# Patient Record
Sex: Female | Born: 1960 | Race: White | Hispanic: No | Smoking: Former smoker
Health system: Southern US, Community
[De-identification: ages and names within clinical notes are randomized; demographics above are authoritative.]

## PROBLEM LIST (undated history)

## (undated) DIAGNOSIS — I10 Essential (primary) hypertension: Secondary | ICD-10-CM

## (undated) DIAGNOSIS — M069 Rheumatoid arthritis, unspecified: Secondary | ICD-10-CM

## (undated) DIAGNOSIS — Z85118 Personal history of other malignant neoplasm of bronchus and lung: Secondary | ICD-10-CM

## (undated) DIAGNOSIS — Z9221 Personal history of antineoplastic chemotherapy: Secondary | ICD-10-CM

## (undated) DIAGNOSIS — Z923 Personal history of irradiation: Secondary | ICD-10-CM

## (undated) DIAGNOSIS — I370 Nonrheumatic pulmonary valve stenosis: Secondary | ICD-10-CM

## (undated) DIAGNOSIS — I351 Nonrheumatic aortic (valve) insufficiency: Principal | ICD-10-CM

## (undated) HISTORY — DX: Personal history of irradiation: Z92.3

## (undated) HISTORY — DX: Personal history of other malignant neoplasm of bronchus and lung: Z85.118

## (undated) HISTORY — DX: Essential (primary) hypertension: I10

## (undated) HISTORY — DX: Personal history of antineoplastic chemotherapy: Z92.21

## (undated) HISTORY — DX: Rheumatoid arthritis, unspecified: M06.9

## (undated) HISTORY — DX: Nonrheumatic aortic (valve) insufficiency: I35.1

## (undated) HISTORY — DX: Nonrheumatic pulmonary valve stenosis: I37.0

---

## 1998-01-26 ENCOUNTER — Ambulatory Visit (HOSPITAL_COMMUNITY): Admission: RE | Admit: 1998-01-26 | Discharge: 1998-01-26 | Payer: Self-pay | Admitting: Obstetrics and Gynecology

## 1999-05-22 ENCOUNTER — Other Ambulatory Visit: Admission: RE | Admit: 1999-05-22 | Discharge: 1999-05-22 | Payer: Self-pay | Admitting: Obstetrics and Gynecology

## 2000-01-27 ENCOUNTER — Encounter: Payer: Self-pay | Admitting: Internal Medicine

## 2000-01-27 ENCOUNTER — Inpatient Hospital Stay (HOSPITAL_COMMUNITY): Admission: EM | Admit: 2000-01-27 | Discharge: 2000-01-31 | Payer: Self-pay | Admitting: *Deleted

## 2000-01-29 ENCOUNTER — Encounter: Payer: Self-pay | Admitting: Internal Medicine

## 2000-03-20 ENCOUNTER — Encounter: Payer: Self-pay | Admitting: Neurology

## 2000-03-20 ENCOUNTER — Encounter: Payer: Self-pay | Admitting: Internal Medicine

## 2000-03-20 ENCOUNTER — Emergency Department (HOSPITAL_COMMUNITY): Admission: EM | Admit: 2000-03-20 | Discharge: 2000-03-20 | Payer: Self-pay | Admitting: Emergency Medicine

## 2002-01-19 ENCOUNTER — Encounter: Payer: Self-pay | Admitting: Emergency Medicine

## 2002-01-19 ENCOUNTER — Emergency Department (HOSPITAL_COMMUNITY): Admission: EM | Admit: 2002-01-19 | Discharge: 2002-01-19 | Payer: Self-pay | Admitting: Emergency Medicine

## 2004-04-10 ENCOUNTER — Encounter (INDEPENDENT_AMBULATORY_CARE_PROVIDER_SITE_OTHER): Payer: Self-pay | Admitting: Specialist

## 2004-04-10 ENCOUNTER — Inpatient Hospital Stay (HOSPITAL_COMMUNITY): Admission: EM | Admit: 2004-04-10 | Discharge: 2004-04-29 | Payer: Self-pay | Admitting: Emergency Medicine

## 2004-04-11 ENCOUNTER — Encounter (INDEPENDENT_AMBULATORY_CARE_PROVIDER_SITE_OTHER): Payer: Self-pay | Admitting: *Deleted

## 2004-04-12 ENCOUNTER — Encounter: Payer: Self-pay | Admitting: Thoracic Surgery

## 2004-04-12 ENCOUNTER — Ambulatory Visit: Payer: Self-pay | Admitting: Oncology

## 2004-04-13 ENCOUNTER — Encounter (INDEPENDENT_AMBULATORY_CARE_PROVIDER_SITE_OTHER): Payer: Self-pay | Admitting: *Deleted

## 2004-04-13 ENCOUNTER — Ambulatory Visit: Payer: Self-pay | Admitting: Dentistry

## 2004-04-17 ENCOUNTER — Encounter (INDEPENDENT_AMBULATORY_CARE_PROVIDER_SITE_OTHER): Payer: Self-pay | Admitting: Specialist

## 2004-04-18 ENCOUNTER — Encounter: Payer: Self-pay | Admitting: Oncology

## 2004-04-19 ENCOUNTER — Ambulatory Visit: Payer: Self-pay | Admitting: Oncology

## 2004-04-29 ENCOUNTER — Ambulatory Visit: Payer: Self-pay | Admitting: Oncology

## 2004-05-06 ENCOUNTER — Ambulatory Visit: Payer: Self-pay | Admitting: Oncology

## 2004-05-22 ENCOUNTER — Emergency Department (HOSPITAL_COMMUNITY): Admission: EM | Admit: 2004-05-22 | Discharge: 2004-05-23 | Payer: Self-pay | Admitting: Emergency Medicine

## 2004-06-06 ENCOUNTER — Ambulatory Visit: Admission: RE | Admit: 2004-06-06 | Discharge: 2004-06-06 | Payer: Self-pay | Admitting: Radiation Oncology

## 2004-06-14 ENCOUNTER — Ambulatory Visit (HOSPITAL_COMMUNITY): Admission: RE | Admit: 2004-06-14 | Discharge: 2004-06-14 | Payer: Self-pay | Admitting: Oncology

## 2004-06-21 ENCOUNTER — Ambulatory Visit: Payer: Self-pay | Admitting: Oncology

## 2004-06-24 ENCOUNTER — Ambulatory Visit: Payer: Self-pay | Admitting: Psychology

## 2004-07-01 ENCOUNTER — Ambulatory Visit: Payer: Self-pay | Admitting: Psychology

## 2004-07-08 ENCOUNTER — Ambulatory Visit: Payer: Self-pay | Admitting: Psychology

## 2004-07-15 ENCOUNTER — Ambulatory Visit: Payer: Self-pay | Admitting: Psychology

## 2004-08-05 ENCOUNTER — Ambulatory Visit (HOSPITAL_COMMUNITY): Admission: RE | Admit: 2004-08-05 | Discharge: 2004-08-05 | Payer: Self-pay | Admitting: Oncology

## 2004-08-12 ENCOUNTER — Ambulatory Visit: Admission: RE | Admit: 2004-08-12 | Discharge: 2004-09-30 | Payer: Self-pay | Admitting: Radiation Oncology

## 2004-08-13 ENCOUNTER — Ambulatory Visit: Payer: Self-pay | Admitting: Oncology

## 2004-08-15 ENCOUNTER — Ambulatory Visit: Payer: Self-pay | Admitting: Professional

## 2004-08-22 ENCOUNTER — Ambulatory Visit: Payer: Self-pay | Admitting: Professional

## 2004-08-29 ENCOUNTER — Ambulatory Visit: Payer: Self-pay | Admitting: Professional

## 2004-09-05 ENCOUNTER — Ambulatory Visit: Payer: Self-pay | Admitting: Professional

## 2004-09-19 ENCOUNTER — Ambulatory Visit: Payer: Self-pay | Admitting: Professional

## 2004-10-03 ENCOUNTER — Ambulatory Visit: Payer: Self-pay | Admitting: Professional

## 2004-10-17 ENCOUNTER — Ambulatory Visit: Payer: Self-pay | Admitting: Professional

## 2004-11-04 ENCOUNTER — Ambulatory Visit: Payer: Self-pay | Admitting: Oncology

## 2004-11-05 ENCOUNTER — Ambulatory Visit (HOSPITAL_COMMUNITY): Admission: RE | Admit: 2004-11-05 | Discharge: 2004-11-05 | Payer: Self-pay | Admitting: Oncology

## 2004-11-12 ENCOUNTER — Ambulatory Visit (HOSPITAL_COMMUNITY): Admission: RE | Admit: 2004-11-12 | Discharge: 2004-11-12 | Payer: Self-pay | Admitting: Oncology

## 2004-12-12 ENCOUNTER — Encounter: Admission: RE | Admit: 2004-12-12 | Discharge: 2004-12-12 | Payer: Self-pay | Admitting: Obstetrics and Gynecology

## 2005-01-19 ENCOUNTER — Ambulatory Visit: Payer: Self-pay | Admitting: Oncology

## 2005-01-29 ENCOUNTER — Ambulatory Visit (HOSPITAL_COMMUNITY): Admission: RE | Admit: 2005-01-29 | Discharge: 2005-01-29 | Payer: Self-pay | Admitting: Oncology

## 2005-02-04 ENCOUNTER — Ambulatory Visit (HOSPITAL_COMMUNITY): Admission: RE | Admit: 2005-02-04 | Discharge: 2005-02-04 | Payer: Self-pay | Admitting: Oncology

## 2005-05-15 ENCOUNTER — Ambulatory Visit: Payer: Self-pay | Admitting: Oncology

## 2005-05-30 ENCOUNTER — Ambulatory Visit (HOSPITAL_COMMUNITY): Admission: RE | Admit: 2005-05-30 | Discharge: 2005-05-30 | Payer: Self-pay | Admitting: Oncology

## 2005-07-09 ENCOUNTER — Ambulatory Visit: Payer: Self-pay | Admitting: Oncology

## 2005-07-11 LAB — CBC WITH DIFFERENTIAL/PLATELET
BASO%: 0.4 % (ref 0.0–2.0)
HCT: 36.2 % (ref 34.8–46.6)
LYMPH%: 16.2 % (ref 14.0–48.0)
MCH: 30.4 pg (ref 26.0–34.0)
MCHC: 33.8 g/dL (ref 32.0–36.0)
MCV: 90 fL (ref 81.0–101.0)
MONO#: 0.4 10*3/uL (ref 0.1–0.9)
MONO%: 5.4 % (ref 0.0–13.0)
NEUT%: 73.8 % (ref 39.6–76.8)
Platelets: 330 10*3/uL (ref 145–400)
WBC: 6.9 10*3/uL (ref 3.9–10.0)

## 2005-07-11 LAB — COMPREHENSIVE METABOLIC PANEL
ALT: 13 U/L (ref 0–40)
Alkaline Phosphatase: 55 U/L (ref 39–117)
CO2: 29 mEq/L (ref 19–32)
Creatinine, Ser: 0.8 mg/dL (ref 0.4–1.2)
Total Bilirubin: 0.4 mg/dL (ref 0.3–1.2)

## 2005-08-13 ENCOUNTER — Ambulatory Visit (HOSPITAL_COMMUNITY): Admission: RE | Admit: 2005-08-13 | Discharge: 2005-08-13 | Payer: Self-pay | Admitting: Oncology

## 2005-11-12 ENCOUNTER — Ambulatory Visit: Payer: Self-pay | Admitting: Oncology

## 2005-11-14 ENCOUNTER — Ambulatory Visit (HOSPITAL_COMMUNITY): Admission: RE | Admit: 2005-11-14 | Discharge: 2005-11-14 | Payer: Self-pay | Admitting: Oncology

## 2005-11-14 LAB — COMPREHENSIVE METABOLIC PANEL
ALT: 11 U/L (ref 0–40)
AST: 11 U/L (ref 0–37)
Alkaline Phosphatase: 65 U/L (ref 39–117)
Creatinine, Ser: 0.71 mg/dL (ref 0.40–1.20)
Sodium: 140 mEq/L (ref 135–145)
Total Bilirubin: 0.4 mg/dL (ref 0.3–1.2)
Total Protein: 6.9 g/dL (ref 6.0–8.3)

## 2005-11-14 LAB — CBC WITH DIFFERENTIAL/PLATELET
BASO%: 0.3 % (ref 0.0–2.0)
EOS%: 3.4 % (ref 0.0–7.0)
HCT: 37 % (ref 34.8–46.6)
LYMPH%: 12.1 % — ABNORMAL LOW (ref 14.0–48.0)
MCH: 30.9 pg (ref 26.0–34.0)
MCHC: 34 g/dL (ref 32.0–36.0)
NEUT%: 80 % — ABNORMAL HIGH (ref 39.6–76.8)
Platelets: 329 10*3/uL (ref 145–400)
RBC: 4.07 10*6/uL (ref 3.70–5.32)
WBC: 8.7 10*3/uL (ref 3.9–10.0)

## 2005-12-05 ENCOUNTER — Ambulatory Visit (HOSPITAL_COMMUNITY): Admission: RE | Admit: 2005-12-05 | Discharge: 2005-12-05 | Payer: Self-pay | Admitting: Oncology

## 2005-12-10 ENCOUNTER — Ambulatory Visit (HOSPITAL_COMMUNITY): Admission: RE | Admit: 2005-12-10 | Discharge: 2005-12-10 | Payer: Self-pay | Admitting: Oncology

## 2006-01-06 ENCOUNTER — Encounter: Admission: RE | Admit: 2006-01-06 | Discharge: 2006-01-06 | Payer: Self-pay | Admitting: Orthopedic Surgery

## 2006-01-07 ENCOUNTER — Encounter: Admission: RE | Admit: 2006-01-07 | Discharge: 2006-04-07 | Payer: Self-pay | Admitting: Orthopedic Surgery

## 2006-02-23 IMAGING — CT NM PET TUM IMG SKULL BASE T - THIGH
5 series · 25 of 25 positions shown · IV contrast ([ID])
Comparison: 06/14/04.
COMPARISON: 04/13/04.
COMPARISON: PET CT 04/12/04.

CLINICAL DATA: Lung cancer status post chemotherapy
CHEST CT WITH CONTRAST:
TECHNIQUE: Multidetector CT imaging of the chest was performed following the standard protocol during bolus administration of intravenous contrast.
Contrast:  140cc Omnipaque 300 was utilized.
TECHNIQUE: Multidetector CT imaging of the abdomen was performed following the standard protocol during bolus administration of intravenous contrast.
TECHNIQUE: 16.8 mCi F-18 FDG was injected intravenously via the left antecubital vein.  Full-ring PET imaging was performed from the skull base through the mid-thighs 60 minutes after injection.  CT data was obtained and used for attenuation correction and anatomic localization only.  (This was not acquired as a diagnostic CT examination).

[Series 1: pet ac · axial · 3.3mm · 4.69mm/px · z∈[-870,+0]mm · 7 of 267 slices shown]
[im 1/267]
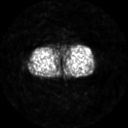
[im 45/267]
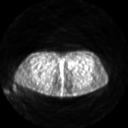
[im 89/267]
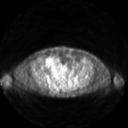
[im 134/267]
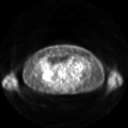
[im 178/267]
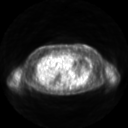
[im 222/267]
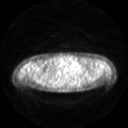
[im 267/267]
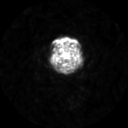

[Series 2: ct images · axial · 3.8mm · 0.98mm/px · z∈[-870,+0]mm · 8 of 267 slices shown]
[im 1/267]
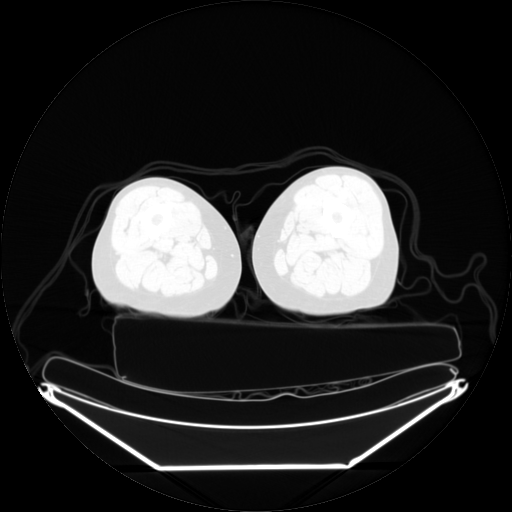
[im 39/267]
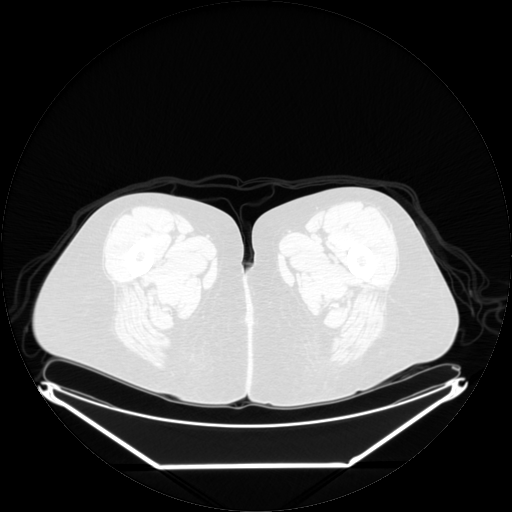
[im 77/267]
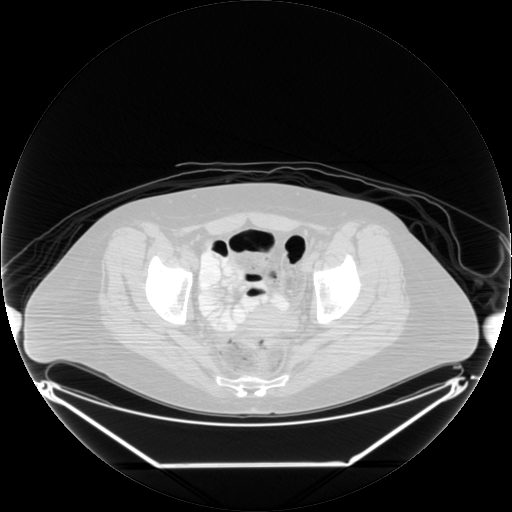
[im 115/267]
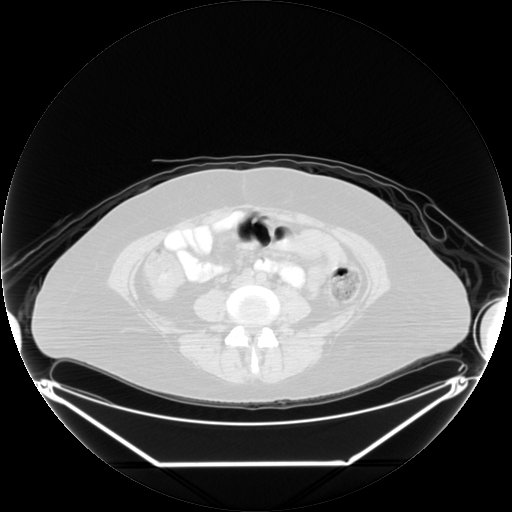
[im 153/267]
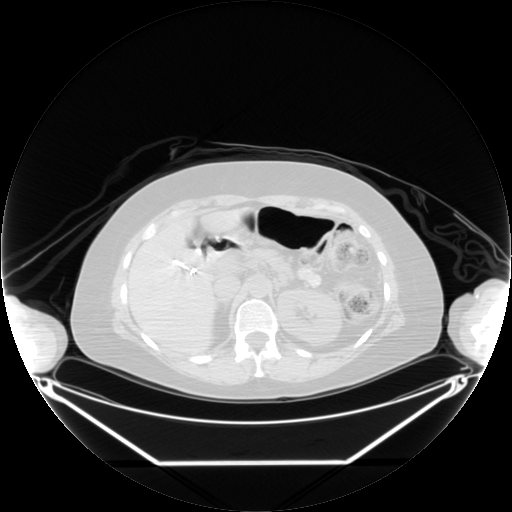
[im 191/267]
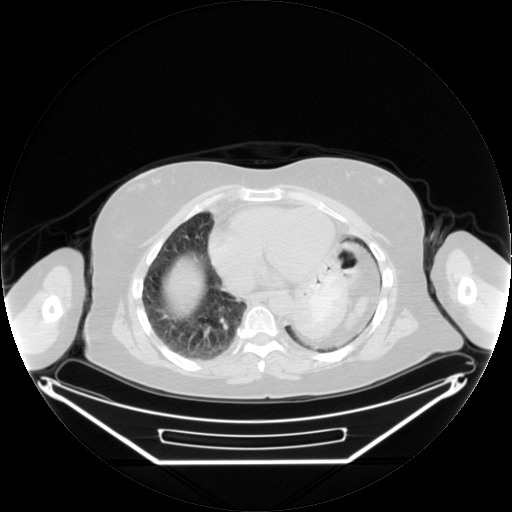
[im 229/267]
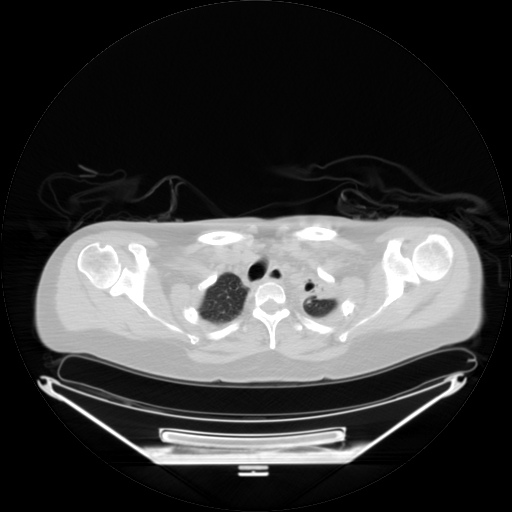
[im 267/267]
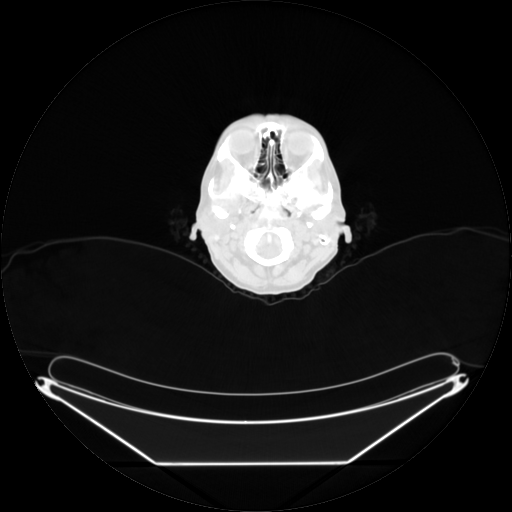

[Series 2: pet nac · axial · 3.3mm · 4.69mm/px · z∈[-870,+0]mm · 8 of 267 slices shown]
[im 1/267]
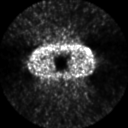
[im 39/267]
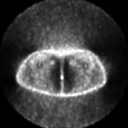
[im 77/267]
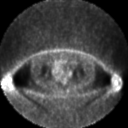
[im 115/267]
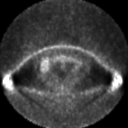
[im 153/267]
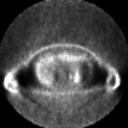
[im 191/267]
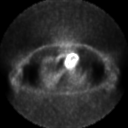
[im 229/267]
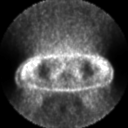
[im 267/267]
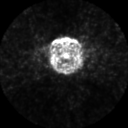

[Series 123: mip · coronal · 3.3mm · 4.69mm/px · 1 of 30 slices shown]
[im 1/30]
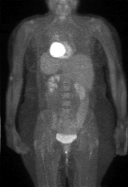

[Series 151: reformatted · axial · 3.3mm · 0.98mm/px · 1 of 10 slices shown]
[im 1/10]
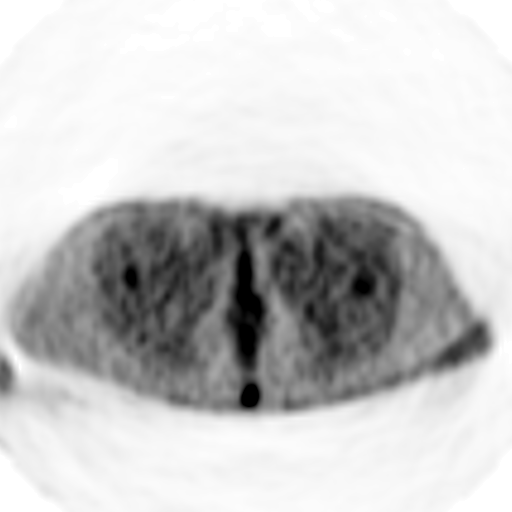

[25 of 25 positions shown; findings below may reference images not displayed]

FINDINGS: There is a similar appearance of left upper lobe collapse and atelectatic changes.  Although there is mild fullness near the left hilar region, a well-defined measurable mass is not identified.  No new abnormality detected.
IMPRESSION: Stable post therapy exam without new abnormality detected.
ABDOMEN CT WITH CONTRAST:
FINDINGS: Left pleural effusion has cleared since prior exam.  Status post cholecystectomy.  Slightly heterogeneous appearance of the peripheral aspect of the liver without focal lesions suspicious for malignancy identified.  Right renal cyst unchanged. Otherwise no focal renal, adrenal, splenic or pancreatic lesion identified.  Mild atherosclerotic type changes of the abdominal aorta without aneurysmal dilatation. No surrounding abnormality. No bony destructive lesion.  Degenerative changes lumbar spine with multifactorial spinal stenosis lower lumbar region.  Elevation of the left hemidiaphragm.
IMPRESSION: No evidence of metastatic disease in the region of the abdomen.
FDG PET-CT TUMOR IMAGING (SKULL BASE TO THIGHS):
Fasting Blood Glucose:  86.
FINDINGS: There has been significant improvement since prior examination.  At such time, FDG uptake was noted involving the left lung with maximal SUV within left upper lobe mass of 23.5 and pleural nodularity with maximal SUV of 14.   small amount of residual FDG uptake along the medial aspect of the left upper lung zone/left hilar region with maximal SUV of 5 is noted.
Previously, there was prominent FDG uptake along the right aspect of the mediastinum anterior and lateral to the ascending aorta and anterior to the superior vena cava which had a maximal SUV of 19, and now has a maximal SUV of 4. What remains at the present time may represent residual tumor/treated tumor and can be evaluated on close follow-up.
FDG uptake along the pharyngeal tonsils, palatine tonsils, and cricopharyngeal region felt to represent normal finding.
Focal small area of FDG uptake within soft tissue just to the left of the upper gluteal crease (image 219) with maximal SUV of 7.2. This was noted previously and is of questionable significance.  This would be somewhat atypical for a metastatic focus, although this cannot be completely excluded. It is possible this is related to infected sebaceous cyst or other soft tissue abnormality.
IMPRESSION: 1.  Significant improvement with marked decrease in amount of FDG uptake involving left upper lung mass, adjacent pleural thickening and left hilar uptake with residual FDG uptake in this region with maximal SUV of 5.  There has also been a significant decrease in FDG uptake along the right mediastinal region with residual FDG uptake with a maximal SUV of 4.  What remains may represent residual/treated tumor and can be further evaluated on close follow-up.
2.  Nonspecific finding of focal prominent FDG uptake along the left aspect of the upper gluteal crease unchanged.  This may be unrelated to malignancy such as an infected sebaceous cyst, although malignancy cannot be completely excluded.

## 2006-04-22 ENCOUNTER — Ambulatory Visit: Payer: Self-pay | Admitting: Oncology

## 2006-05-20 LAB — COMPREHENSIVE METABOLIC PANEL
CO2: 26 mEq/L (ref 19–32)
Creatinine, Ser: 0.66 mg/dL (ref 0.40–1.20)
Glucose, Bld: 88 mg/dL (ref 70–99)
Total Bilirubin: 0.3 mg/dL (ref 0.3–1.2)

## 2006-05-20 LAB — CBC WITH DIFFERENTIAL/PLATELET
Eosinophils Absolute: 0.2 10*3/uL (ref 0.0–0.5)
HCT: 37.4 % (ref 34.8–46.6)
LYMPH%: 14.3 % (ref 14.0–48.0)
MCHC: 34.5 g/dL (ref 32.0–36.0)
MONO#: 0.5 10*3/uL (ref 0.1–0.9)
NEUT#: 6.5 10*3/uL (ref 1.5–6.5)
NEUT%: 77.7 % — ABNORMAL HIGH (ref 39.6–76.8)
Platelets: 292 10*3/uL (ref 145–400)
WBC: 8.4 10*3/uL (ref 3.9–10.0)

## 2006-08-14 ENCOUNTER — Ambulatory Visit: Payer: Self-pay | Admitting: Oncology

## 2006-08-17 ENCOUNTER — Ambulatory Visit (HOSPITAL_COMMUNITY): Admission: RE | Admit: 2006-08-17 | Discharge: 2006-08-17 | Payer: Self-pay | Admitting: Oncology

## 2006-08-19 LAB — CBC WITH DIFFERENTIAL/PLATELET
Eosinophils Absolute: 0.3 10*3/uL (ref 0.0–0.5)
HCT: 36.3 % (ref 34.8–46.6)
LYMPH%: 14.5 % (ref 14.0–48.0)
MONO#: 0.4 10*3/uL (ref 0.1–0.9)
NEUT#: 6.6 10*3/uL — ABNORMAL HIGH (ref 1.5–6.5)
NEUT%: 76.9 % — ABNORMAL HIGH (ref 39.6–76.8)
Platelets: 329 10*3/uL (ref 145–400)
WBC: 8.5 10*3/uL (ref 3.9–10.0)

## 2006-08-19 LAB — COMPREHENSIVE METABOLIC PANEL
CO2: 25 mEq/L (ref 19–32)
Glucose, Bld: 102 mg/dL — ABNORMAL HIGH (ref 70–99)
Sodium: 143 mEq/L (ref 135–145)
Total Bilirubin: 0.3 mg/dL (ref 0.3–1.2)
Total Protein: 7.1 g/dL (ref 6.0–8.3)

## 2006-10-29 ENCOUNTER — Encounter: Admission: RE | Admit: 2006-10-29 | Discharge: 2006-10-29 | Payer: Self-pay | Admitting: Obstetrics and Gynecology

## 2006-11-19 ENCOUNTER — Ambulatory Visit: Payer: Self-pay | Admitting: Oncology

## 2006-11-23 LAB — COMPREHENSIVE METABOLIC PANEL
Albumin: 4 g/dL (ref 3.5–5.2)
CO2: 26 mEq/L (ref 19–32)
Calcium: 9.7 mg/dL (ref 8.4–10.5)
Chloride: 105 mEq/L (ref 96–112)
Glucose, Bld: 82 mg/dL (ref 70–99)
Potassium: 4.7 mEq/L (ref 3.5–5.3)
Sodium: 141 mEq/L (ref 135–145)
Total Protein: 7.3 g/dL (ref 6.0–8.3)

## 2006-11-23 LAB — CBC WITH DIFFERENTIAL/PLATELET
Eosinophils Absolute: 0.2 10*3/uL (ref 0.0–0.5)
LYMPH%: 14.8 % (ref 14.0–48.0)
MONO#: 0.6 10*3/uL (ref 0.1–0.9)
NEUT#: 6.5 10*3/uL (ref 1.5–6.5)
Platelets: 358 10*3/uL (ref 145–400)
RBC: 4.22 10*6/uL (ref 3.70–5.32)
RDW: 15.7 % — ABNORMAL HIGH (ref 11.3–14.5)
WBC: 8.6 10*3/uL (ref 3.9–10.0)
lymph#: 1.3 10*3/uL (ref 0.9–3.3)

## 2006-11-30 ENCOUNTER — Ambulatory Visit (HOSPITAL_COMMUNITY): Admission: RE | Admit: 2006-11-30 | Discharge: 2006-11-30 | Payer: Self-pay | Admitting: Oncology

## 2007-01-06 ENCOUNTER — Encounter: Admission: RE | Admit: 2007-01-06 | Discharge: 2007-01-06 | Payer: Self-pay | Admitting: Orthopedic Surgery

## 2007-06-04 ENCOUNTER — Ambulatory Visit: Payer: Self-pay | Admitting: Oncology

## 2007-06-08 ENCOUNTER — Ambulatory Visit (HOSPITAL_COMMUNITY): Admission: RE | Admit: 2007-06-08 | Discharge: 2007-06-08 | Payer: Self-pay | Admitting: Oncology

## 2007-06-09 LAB — COMPREHENSIVE METABOLIC PANEL
ALT: 11 U/L (ref 0–35)
AST: 11 U/L (ref 0–37)
CO2: 22 mEq/L (ref 19–32)
Calcium: 8.9 mg/dL (ref 8.4–10.5)
Chloride: 104 mEq/L (ref 96–112)
Creatinine, Ser: 0.82 mg/dL (ref 0.40–1.20)
Sodium: 138 mEq/L (ref 135–145)
Total Bilirubin: 0.5 mg/dL (ref 0.3–1.2)
Total Protein: 7.4 g/dL (ref 6.0–8.3)

## 2007-06-09 LAB — CBC WITH DIFFERENTIAL/PLATELET
BASO%: 0.5 % (ref 0.0–2.0)
EOS%: 2.9 % (ref 0.0–7.0)
MCH: 27.3 pg (ref 26.0–34.0)
MCHC: 33.7 g/dL (ref 32.0–36.0)
MONO#: 0.3 10*3/uL (ref 0.1–0.9)
NEUT%: 79.5 % — ABNORMAL HIGH (ref 39.6–76.8)
RBC: 4.36 10*6/uL (ref 3.70–5.32)
WBC: 8.1 10*3/uL (ref 3.9–10.0)
lymph#: 1.1 10*3/uL (ref 0.9–3.3)

## 2007-11-02 ENCOUNTER — Encounter: Admission: RE | Admit: 2007-11-02 | Discharge: 2007-11-02 | Payer: Self-pay | Admitting: Oncology

## 2007-11-18 ENCOUNTER — Ambulatory Visit: Payer: Self-pay | Admitting: Oncology

## 2007-11-25 LAB — COMPREHENSIVE METABOLIC PANEL
ALT: 9 U/L (ref 0–35)
AST: 10 U/L (ref 0–37)
Albumin: 3.7 g/dL (ref 3.5–5.2)
CO2: 25 mEq/L (ref 19–32)
Calcium: 9.1 mg/dL (ref 8.4–10.5)
Chloride: 104 mEq/L (ref 96–112)
Potassium: 4.1 mEq/L (ref 3.5–5.3)
Sodium: 139 mEq/L (ref 135–145)
Total Protein: 7.1 g/dL (ref 6.0–8.3)

## 2007-11-25 LAB — CBC WITH DIFFERENTIAL/PLATELET
BASO%: 0.4 % (ref 0.0–2.0)
EOS%: 4 % (ref 0.0–7.0)
HCT: 33.6 % — ABNORMAL LOW (ref 34.8–46.6)
MCHC: 32.7 g/dL (ref 32.0–36.0)
MONO#: 0.4 10*3/uL (ref 0.1–0.9)
NEUT%: 67.2 % (ref 39.6–76.8)
RBC: 4.14 10*6/uL (ref 3.70–5.32)
RDW: 16.3 % — ABNORMAL HIGH (ref 11.3–14.5)
WBC: 6.4 10*3/uL (ref 3.9–10.0)
lymph#: 1.5 10*3/uL (ref 0.9–3.3)

## 2007-12-03 ENCOUNTER — Ambulatory Visit (HOSPITAL_COMMUNITY): Admission: RE | Admit: 2007-12-03 | Discharge: 2007-12-03 | Payer: Self-pay | Admitting: Oncology

## 2008-01-05 ENCOUNTER — Ambulatory Visit: Payer: Self-pay | Admitting: Oncology

## 2008-01-05 ENCOUNTER — Encounter: Admission: AD | Admit: 2008-01-05 | Discharge: 2008-01-05 | Payer: Self-pay | Admitting: Dentistry

## 2008-01-05 ENCOUNTER — Ambulatory Visit: Payer: Self-pay | Admitting: Dentistry

## 2008-01-27 ENCOUNTER — Ambulatory Visit: Payer: Self-pay | Admitting: Dentistry

## 2008-01-27 ENCOUNTER — Ambulatory Visit (HOSPITAL_COMMUNITY): Admission: RE | Admit: 2008-01-27 | Discharge: 2008-01-27 | Payer: Self-pay | Admitting: Dentistry

## 2008-01-27 HISTORY — PX: OTHER SURGICAL HISTORY: SHX169

## 2008-04-06 ENCOUNTER — Ambulatory Visit: Payer: Self-pay | Admitting: Dentistry

## 2008-06-05 ENCOUNTER — Ambulatory Visit (HOSPITAL_COMMUNITY): Admission: RE | Admit: 2008-06-05 | Discharge: 2008-06-05 | Payer: Self-pay | Admitting: Oncology

## 2008-06-05 ENCOUNTER — Ambulatory Visit: Payer: Self-pay | Admitting: Oncology

## 2008-06-07 LAB — COMPREHENSIVE METABOLIC PANEL
ALT: 8 U/L (ref 0–35)
Albumin: 3.7 g/dL (ref 3.5–5.2)
BUN: 13 mg/dL (ref 6–23)
CO2: 28 mEq/L (ref 19–32)
Calcium: 9.3 mg/dL (ref 8.4–10.5)
Chloride: 102 mEq/L (ref 96–112)
Creatinine, Ser: 0.77 mg/dL (ref 0.40–1.20)
Potassium: 4.2 mEq/L (ref 3.5–5.3)

## 2008-06-07 LAB — CBC WITH DIFFERENTIAL/PLATELET
BASO%: 0.3 % (ref 0.0–2.0)
Basophils Absolute: 0 10*3/uL (ref 0.0–0.1)
HCT: 35.5 % (ref 34.8–46.6)
HGB: 11.7 g/dL (ref 11.6–15.9)
MONO#: 0.3 10*3/uL (ref 0.1–0.9)
NEUT%: 74.2 % (ref 38.4–76.8)
RDW: 15.7 % — ABNORMAL HIGH (ref 11.2–14.5)
WBC: 6.9 10*3/uL (ref 3.9–10.3)
lymph#: 1.3 10*3/uL (ref 0.9–3.3)

## 2008-08-15 ENCOUNTER — Ambulatory Visit: Payer: Self-pay | Admitting: Dentistry

## 2008-11-02 ENCOUNTER — Encounter: Admission: RE | Admit: 2008-11-02 | Discharge: 2008-11-02 | Payer: Self-pay | Admitting: Oncology

## 2008-12-04 ENCOUNTER — Ambulatory Visit (HOSPITAL_COMMUNITY): Admission: RE | Admit: 2008-12-04 | Discharge: 2008-12-04 | Payer: Self-pay | Admitting: Oncology

## 2008-12-06 ENCOUNTER — Ambulatory Visit: Payer: Self-pay | Admitting: Oncology

## 2008-12-08 LAB — COMPREHENSIVE METABOLIC PANEL
ALT: 12 U/L (ref 0–35)
AST: 13 U/L (ref 0–37)
Alkaline Phosphatase: 97 U/L (ref 39–117)
Creatinine, Ser: 0.82 mg/dL (ref 0.40–1.20)
Total Bilirubin: 0.3 mg/dL (ref 0.3–1.2)

## 2008-12-08 LAB — CBC WITH DIFFERENTIAL/PLATELET
BASO%: 0.1 % (ref 0.0–2.0)
EOS%: 2.4 % (ref 0.0–7.0)
HCT: 36.6 % (ref 34.8–46.6)
LYMPH%: 16.3 % (ref 14.0–49.7)
MCH: 27.6 pg (ref 25.1–34.0)
MCHC: 33.3 g/dL (ref 31.5–36.0)
MONO%: 5.8 % (ref 0.0–14.0)
NEUT%: 75.4 % (ref 38.4–76.8)
Platelets: 285 10*3/uL (ref 145–400)
lymph#: 1.2 10*3/uL (ref 0.9–3.3)

## 2008-12-08 LAB — IRON AND TIBC: %SAT: 12 % — ABNORMAL LOW (ref 20–55)

## 2009-03-12 ENCOUNTER — Ambulatory Visit: Payer: Self-pay | Admitting: Dentistry

## 2009-03-15 ENCOUNTER — Ambulatory Visit (HOSPITAL_COMMUNITY): Admission: RE | Admit: 2009-03-15 | Discharge: 2009-03-15 | Payer: Self-pay | Admitting: Gastroenterology

## 2009-03-16 ENCOUNTER — Encounter: Admission: RE | Admit: 2009-03-16 | Discharge: 2009-03-16 | Payer: Self-pay | Admitting: Rheumatology

## 2009-05-28 ENCOUNTER — Ambulatory Visit: Payer: Self-pay | Admitting: Oncology

## 2009-05-28 ENCOUNTER — Ambulatory Visit (HOSPITAL_COMMUNITY): Admission: RE | Admit: 2009-05-28 | Discharge: 2009-05-28 | Payer: Self-pay | Admitting: Oncology

## 2009-09-11 ENCOUNTER — Ambulatory Visit: Payer: Self-pay | Admitting: Dentistry

## 2009-10-25 ENCOUNTER — Ambulatory Visit: Payer: Self-pay | Admitting: Oncology

## 2009-10-30 LAB — COMPREHENSIVE METABOLIC PANEL
ALT: 12 U/L (ref 0–35)
AST: 18 U/L (ref 0–37)
Calcium: 9.2 mg/dL (ref 8.4–10.5)
Chloride: 104 mEq/L (ref 96–112)
Creatinine, Ser: 0.82 mg/dL (ref 0.40–1.20)
Potassium: 4.2 mEq/L (ref 3.5–5.3)

## 2009-10-30 LAB — CBC WITH DIFFERENTIAL/PLATELET
BASO%: 0.3 % (ref 0.0–2.0)
EOS%: 1.8 % (ref 0.0–7.0)
MCH: 29.8 pg (ref 25.1–34.0)
MCHC: 33.4 g/dL (ref 31.5–36.0)
NEUT%: 68.3 % (ref 38.4–76.8)
RBC: 4.07 10*6/uL (ref 3.70–5.45)
RDW: 17.2 % — ABNORMAL HIGH (ref 11.2–14.5)
lymph#: 1.3 10*3/uL (ref 0.9–3.3)

## 2009-11-07 ENCOUNTER — Encounter: Admission: RE | Admit: 2009-11-07 | Discharge: 2009-11-07 | Payer: Self-pay | Admitting: Oncology

## 2010-07-02 ENCOUNTER — Encounter (HOSPITAL_COMMUNITY): Payer: Self-pay | Admitting: Dentistry

## 2010-07-02 DIAGNOSIS — K1370 Unspecified lesions of oral mucosa: Secondary | ICD-10-CM

## 2010-07-09 NOTE — Consult Note (Signed)
NAMERAEGEN, TARPLEY NO.:  [id number]   MEDICAL RECORD NO.:  [id number]          PATIENT TYPE:  OUT   LOCATION:                               FACILITY:  [facility]   PHYSICIAN:  [name], D.D.S.DATE OF BIRTH:  [birth year]   DATE OF CONSULTATION:  01/05/2008  DATE OF DISCHARGE:                                 CONSULTATION   [name] is a 50 year old female referred by Dr. Jama Flavors  for dental consultation.  Patient with a history of non-small cell lung  cancer initially diagnosed in February of 2006.  Patient completed  chemoradiation therapy and has had no evidence of recurrent disease to  date.  Dental consultation requested to evaluate poor dentition as a  potential source of infection to her systemic condition.   MEDICAL HISTORY:  1. History of non-small cell lung cancer involving the left upper lobe      in February of 2006 - stage IIIB.      a.     Status post chemotherapy Dr. Darrold Span utilizing Taxol and       carboplatin.      b.     Status post radiation therapy with Dr. Kathrynn Running.  2. History of rheumatoid arthritis followed by Dr. Lennox Pippins.  3. History of anxiety depressive disorder.  4. Status post cholecystectomy in 2002.  5. 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  6. History of anemia currently being evaluatedfor etiology.   ALLERGIES:  None known.   MEDICATIONS:  1. Cymbalta 60 mg daily.  2. Desyrel 100 mg at bedtime.  3. Relafen 750 mg twice daily.  4. Protonix 40 mg daily.  5. Vicodin 5/500 one to two tablets every 4 hours as needed for pain.   SOCIAL HISTORY:  The patient is divorced.  The patient has one son who  is currently incarcerated in prison in [place].  Son is 8 years  of age.  Patient with a history of smoking 1-2 packs per day for 30  years.  Patient did quit in 2006.  Patient also with a history of  alcohol use but quit in 2006 as well.  Patient also with a  history of  cocaine use but none since 2006.   FAMILY HISTORY:  Father died and had a history of myocardial infarction  and cirrhosis of the liver.  Mother died of stroke complications.   FUNCTIONAL ASSESSMENT:  The patient remains independent for ADLs at this  time.   REVIEW OF SYSTEMS:  This is reviewed with the patient and is included in  the dental consultation record.   DENTAL HISTORY:  Chief complaint:  The patient needs a dental evaluation  to evaluate poor dentition.   HISTORY OF PRESENT ILLNESS:  The patient gives a history of toothaches  involving multiple teeth. Patient indicates that the pain occurs on an  intermittent basis.  Patient currently complaining of a dull, achy pain  involving tooth #6 at this time.  The patient indicates that the pain is  spontaenous and irreversible in nature.  Patient uses her Vicodin to  relieve some of the pain.  The patient has not seen a dentist since I  evaluated the patient as part of her pre-chemotherapy dental evaluation  in 2006 as an inpatient.  The patient has no partial dentures that were  fabricated since then.  The patient has not seen a dentist since then.   DENTAL EXAM:  GENERAL:  The patient is a well -developed, well-nourished  female in no acute distress.  VITAL SIGNS:  Blood pressure 100/62, pulse 77, temperature is 98.0.  HEAD/NECK EXAM:  Patient with no lymphadenopathy.  The patient denies  acute TMJ symptoms.  INTRAORAL EXAM:  The patient with normal saliva.  Patient has bilateral  mandibular lingular tori.  There is no evidence of abscess formation at  this time.  DENTITION:  The patient with multiple missing teeth, numbers 1-3, 7, 14,  15, 16, 17, 18, 19 and 20, 30, 31 and 32.  Patient has retained roots in  the area of tooth numbers 4 and 5.  PERIODONTAL:  Patient with chronic advanced periodontal disease with  plaque and calculus accumulations, generalized gingival recession and  generalized tooth mobility as  per dental charting form.  DENTAL CRIES:  Patient has rampant dental caries affecting multiple  teeth as per dental charting form.  ENDODONTIC:  Patient with a history of acute pulpitis symptoms.  Patient  has multiple areas of periapical radiolucency associated with tooth  numbers 4, 5, 6, 11 and 12.  CROWN OR BRIDGE:  There are no crown or bridge restorations.  PROSTHODONTIC:  Patient with no history of partial dentures.  OCCLUSION:  Patient with a poor occlusive scheme secondary to multiple  missing teeth, supraeruption and drifting of the unopposed teeth into  the edentulous areas, and lack of replacement of the missing teeth with  dental prostheses.  Patient also with multiple diastemas.   RADIOGRAPHIC INTERPRETATION:  A panoramic x-ray was taken on January 05, 2008.   There are multiple missing teeth.  There is supraeruption and drifting  of the unopposed teeth into the edentulous areas.  There are rampant  dental caries noted.  There are multiple area os periapical  radiolucency.  There is moderate to severe bone loss.  There are  multiple diastemas.   ASSESSMENT:  1. History of oral neglect.  2. Chronic periodontitis with bone loss.  3. Generalized gingival recession.  4. Generalized tooth mobility.  5. Rampant dental caries.  6. History of acute pulpitis symptoms.  7. Multiple areas of periapical pathology.  8. Multiple diastemas.  9. Supraeruption and drifting of the unopposed teeth into the      edentulous areas.  10.Multiple missing teeth.  11.Multiple retained root segments.  12.No history of partial dentures.  13.Poor occlusal scheme.  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   PLAN/RECOMMENDATIONS:  1. I have discussed the risks, benefits and complications of various      treatment options with the patient in relationship to her medical      and dental conditions.  We discussed various treatment options to      include no treatment, total and subtotal extractions  with      veloplasty, periodontal therapy, dental restorations, root canal      therapy, crown or bridge therapy, implant therapy, and replacing      the missing teeth as indicated after adequate healing.  The patient      currently wishes to proceed with extraction of  all remaining teeth      with alveoloplasty and preprosthetic surgery as indicated.  The      patient was initially offerred surgery for January 07, 2008, but      wished to defer extractions until after the Thanksgiving holiday.      The patient therefore has been scheduled for Thursday, January 27, 2008 at 7:30 in the morning.  The patient will have preoperative      testing done on December 1 in the morning per patient scheduling.  2. Discussion of findings with Drs. Livesay and Levitin as indicated.  3. Fabrication of upper and lower complete dentures after adequate      healing by the dentist of her choice.      [name], D.D.S.  Electronically Signed     RFK/MEDQ  D:  01/05/2008  T:  01/05/2008  Job:  [id number]   cc:   Lennis P. Darrold Span, M.D.  Fax: [phone]   Demetria Pore. Coral Spikes, M.D.  Fax: [phone]   [facility]

## 2010-07-09 NOTE — Op Note (Signed)
NAMEMICHELL, Chelsea NO.:  [id number]   MEDICAL RECORD NO.:  [id number]          PATIENT TYPE:  AMB   LOCATION:  DAY                          FACILITY:  [facility]   PHYSICIAN:  [name], D.D.S.DATE OF BIRTH:  Jul 29, 1960   DATE OF PROCEDURE:  01/27/2008  DATE OF DISCHARGE:                               OPERATIVE REPORT   PREOPERATIVE DIAGNOSES:  1. History of lung cancer.  2. Rheumatoid arthritis.  3. Acute pulpitis.  4. Apical periodontitis.  5. Chronic periodontitis.  6. Mandibular tori.  7. Apically positioned maxillary labial frenum.   POSTOPERATIVE DIAGNOSES:  1. History of lung cancer.  2. Rheumatoid arthritis.  3. Acute pulpitis.  4. Apical periodontitis.  5. Chronic periodontitis.  6. Mandibular tori.  7. Apically positioned maxillary labial frenum.   OPERATIONS:  1. Extraction of remaining teeth (tooth numbers 4, 5, 6, 8, 9, 10, 11,      12, 13, 21, 22, 23, 24, 25, 26, 27, 28, 29).  2. Four quadrants of alveoloplasty.  3. Bilateral mandibular lingual tori reductions.  4. Maxillary labial frenectomy.   SURGEON:  [name], D.D.S.   ASSISTANT:  Public house manager (Sales executive).   ANESTHESIA:  General anesthesia via nasoendotracheal tube.   MEDICATIONS:  1. Ancef 1 gram IV prior to invasive dental procedures.  2. Local anesthesia with a total utilization of 6 carpules each      containing 34 mg lidocaine with 0.017 mg of epinephrine as well as      2 carpules each containing 9 mg of bupivacaine with 0.009 mg of      epinephrine.   SPECIMENS:  There were 18 teeth that were discarded.   DRAINS:  None.   CULTURES:  None.   ESTIMATED BLOOD LOSS:  Less than 50 mL.   FLUIDS:  600 mL lactated Ringer's solution.   COMPLICATIONS:  None.   INDICATIONS:  The patient with a history of lung cancer with previous  chemotherapy.  The patient also significant rheumatoid arthritis.  The  patient was examined and treatment  planned for multiple extraction of  remaining teeth with alveoloplasty and periprosthetic surgery as  indicated.  This treatment plan was formulated to decrease the risk of  complications associated with dental infection from affecting the  patient's systemic health.   OPERATIVE FINDINGS:  The patient was examined in operating room #6.  The  teeth were identified for extraction.  The patient noted be affected by  a history of acute pulpitis, apical periodontitis, chronic  periodontitis, multiple mobile teeth, multiple dental caries, the  presence of bilateral mandibular lingual tori, and an apically  positioned maxillary labial frenum.  The aforementioned necessitated  removal of all remaining teeth with alveoloplasty and pre-prosthetic  surgery as indicated.   DESCRIPTION OF PROCEDURE:  The patient was brought to the main operating  room #6.  The patient was then placed in supine position on the  operating room table.  General anesthesia was then induced via  nasoendotracheal tube.  The patient was then prepped and draped in usual  manner for  dental medicine procedure.  Time out was performed.  The  patient was identified, and procedure was verified.  A throat pack was  placed at this time.  The oral cavity was then thoroughly examined with  findings noted above.  The patient was then ready for the dental  medicine procedure as follows:   Local anesthesia was administered sequentially with a total utilization  of 6 carpules each containing 34 mg of lidocaine with 0.017 mg of  epinephrine as well as 2 carpules each containing 9 mg of bupivacaine  with 0.009 mg of epinephrine.   The maxillary left and right quadrants were first approached.  Anesthesia was delivered via infiltration utilizing lidocaine with  epinephrine.  A 15 blade incision was then made from distal #2 and  extended to the mesial of number 15.  A surgical flap was then carefully  reflected.  Appropriate amounts of  buccal and interseptal bone were  removed at this time.  The maxillary teeth were then subluxated with a  series of straight elevators.  Tooth numbers 4, 5, 6, 8, 9, 10, 11, 12,  and 13 were then removed with a 150 forceps without complications.  Alveoloplasty was then performed utilizing rongeurs and bone file.  The  tissues were then approximated and trimmed appropriately.  The surgical  site was then irrigated with copious amounts of sterile saline.  The  surgical site was then closed from the distal of #2 and extended to the  mesial #8 utilizing 3-0 chromic gut suture in a continuous interrupted  suture technique x1.  The maxillary left quadrant was then closed from  distal #15 and extended to the mesial #9 utilizing 3-0 chromic gut  suture in continuous interrupted suture technique x1.  At this point in  time, the apically positioned maxillary labial frenum was secured with a  hemostat.  A 15 blade was utilized to make appropriate incisions to  allow for a frenectomy to be performed.  The area was surgically  manipulated as indicated.  The surgical site was then irrigated with  copious amounts of sterile saline.  The surgical site was then closed in  a linear fashion with 5 separate 4-0 chromic gut sutures.   At this point in time, the mandibular quadrants were approached.  The  patient given bilateral inferior nerve blocks utilizing bupivacaine with  epinephrine.  Further infiltration was utilizing lidocaine with  epinephrine.  A 15 blade incision was then made from the distal #19,  extended to the distal of #31.  Surgical flap was then carefully  reflected.  Appropriate amounts of buccal and interseptal bone were  removed at this time.  The teeth were subluxated with a series of  straight elevators.  Tooth numbers 21, 22, 23, 24, 25, 26, 27, 28, and  29 were then removed with a 151 forceps without complications.  Alveoloplasty was then performed utilizing rongeurs and bone file.   At  this point in time, the lingual flaps were then further reflected to  expose the bilateral mandibular lingual tori.  These were removed  separately with a surgical handpiece and bur and copious amounts of  sterile saline.  Further alveoloplasty was then performed utilizing  rongeurs and bone file as indicated.  The tissues were then approximated  and trimmed appropriately.  The surgical site was then irrigated with  copious amounts of sterile saline x2.  The mandibular left surgical site  was then closed from distal of #19 and extended to the mesial  of #24  utilizing 3-0 chromic gut suture in a continuous interrupted suture  technique x1.  The mandibular right quadrant was then closed from distal  #31 and extended to the mesial of #25 utilizing 3-0 chromic gut suture  in continuous interrupted suture technique x1.   At this point in time, the entire mouth was irrigated with copious  amounts of sterile saline.  The patient was examined for complications,  and seeing none, dental medicine procedure was deemed to be complete.  A  throat pack was removed at this time.  A series of 4x4 gauzes were  placed in the mouth to aid hemostasis.  An oral airway was placed at  request of the anesthesia team.  After the appropriate amount of time,  the patient was extubated and taken to the post-anesthesia care unit  with stable vital signs and good oxygenation level.  All counts were  correct for dental medicine procedure.  The patient will be seen in  approximately 1 week for evaluation for suture removal.  The patient  will be given appropriate pain medication as indicated.  No further  antibiotic therapy will be indicated at this time due to lack of  purulence.      [name], D.D.S.  Electronically Signed     RFK/MEDQ  D:  01/27/2008  T:  01/27/2008  Job:  [id number]   cc:   Lennis P. Darrold Span, M.D.  Fax: [phone]   Demetria Pore. Coral Spikes, M.D.  Fax: [phone]   [name],  D.D.S.  Fax: [phone]

## 2010-07-12 NOTE — Consult Note (Signed)
[name], [name]                 ACCOUNT NO.:  [id number]   MEDICAL RECORD NO.:  [id number]          PATIENT TYPE:  INP   LOCATION:  0252                         FACILITY:  [facility]   PHYSICIAN:  Lennis P. Livesay, M.D.DATE OF BIRTH:  September 27, 1960   DATE OF CONSULTATION:  04/12/2004  DATE OF DISCHARGE:                                   CONSULTATION   The patient is a 50 year old white female seen in consultation at the  request of Dr. 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 Her  staging work-up is still in progress. The patient has smoked two packs daily  for 30 years which is ongoing. She has had left lateral chest pain which has  been mostly intermittent for about 4 months, some cough without hemoptysis,  some shortness of breath. She may also have lost some weight and has had a  poor appetite but tells me she has also been depressed because of social  situation and stress. She denies suicidal ideation. On review of systems,  otherwise, no other pain that I can tell. She has some slight left-sided  weakness (? residual from an old stroke), no difficulty swallowing. She has  not had a bowel movement in 3 days. She has not had fevers or sweats. Pain  has been incompletely controlled with morphine 2 mg IV q.2h.   ALLERGIES:  NO KNOWN DRUG ALLERGIES.   PAST MEDICAL HISTORY:  Rheumatoid arthritis for 10 years, apparently on  chronic prednisone by Dr. Phylliss Bob. She had a right middle cerebral artery  stroke in 2001 with neurology evaluation then. That stroke apparently  secondary to intracranial atherosclerotic disease. She has had a  cholecystectomy and surgery on her foot in the past.   FAMILY HISTORY:  Mother died of a CVA. Father died of an MI. A grandfather  had lung cancer.   SOCIAL HISTORY:  She has lived in [place] for  30 years. She was living  with her boyfriend prior to admission but tells me she is afraid that she  has been evicted from that location and that she will have nowhere to go  when she leaves the hospital.  Past alcohol, denies drugs, tobacco as above.  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 Her  23 year old son lives with his father in [place]. She has one brother in  [place], one brother in [place] with whom she does not have contact.   PHYSICAL EXAMINATION:  GENERAL:  Alert, crying, cooperative, lying very  still with her left shoulder propped on pillows.  VITAL SIGNS:  Last vitals charted were from 6 a.m. today:  Temperature 98.4,  heart rate 73 and regular, respirations 16 and unlabored, her blood pressure  then was 84/48. The blood pressures charted for the past 2 days have been in  the range of 100 to 110 systolic.  HEENT:  Pupils equal and reactive, not icteric. Mouth slightly  dry.  NECK:  Supple, no JVD, no supraclavicular adenopathy.  LUNGS:  Diminished breath sounds to the left chest anteriorly, no wheezes or  rales.  HEART:  Regular rate and rhythm.  ABDOMEN:  Soft, quiet, and nontender, no appreciable hepatosplenomegaly.  EXTREMITIES:  Lower extremities no edema or cords. She moves all  extremities. I am not able to do more of the neurologic exam. She has normal  saline lock in her left hand.   LABORATORY DATA:  From February 17:  Hemoglobin 8.8, white count 9.2,  platelets 597, MCV of 73. Iron 15, 5% saturation. Sodium 137, potassium 3.9,  chloride 103, CO2 27, glucose 87, BUN 6, creatinine 0.7. From February 14:  Total protein 6.9, albumin 2.4, AST 10, ALT less than 8, alkaline  phosphatase 74, total bilirubin 0.2. Urinalysis was not remarkable.   RADIOLOGY STUDIES:  CT angiogram chest February 14:  No pulmonary emboli.  Left hilar mediastinal mass obstructing left upper lobe bronchus and  encasing left pulmonary artery with  left upper lobe collapse and thickening  of the pericardium. No pulmonary nodules, no changes in the chest wall  structures. MRI of the head February 16:  No metastasis, old right MCA  stroke with residual atrophy, question right parietal skull metastasis.  Cytology from needle biopsy [id number]:  Malignant cells, nonsmall cell  carcinoma consistent with squamous cell.   IMPRESSION AND RECOMMENDATIONS:  Problem 1. 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 She  will probably be a candidate for radiation with sensitizing chemotherapy.  Dr. Kathrynn Running of radiation oncology will see her this weekend. If okay with  the hospitalist service, please transfer to [facility] when  bed available after the bronchoscopy. I will be glad to transfer her to my  service first of next week after she has been moved to [employer].   Problem 2. Pain. Had been better controlled earlier today when she was  receiving the IV morphine every 2 hours but is poorly controlled at present  since she has missed several doses. She is able to take p.o. I will begin MS  Contin 15 mg q.12h. with p.r.n. MSIR.   Problem 3. Iron deficiency anemia, question etiology. Will try ferrous  fumarate or ferrous gluconate but we may still need IV iron.   Problem 4. History of cerebrovascular accident, known to Dr. Sharene Skeans in the  past.   Problem 5. Rheumatoid arthritis. Known to Dr. Phylliss Bob.   Problem 6. Social concerns, possibly evicted from her home. Case manager to  see.      LPL/MEDQ  D:  04/12/2004  T:  04/14/2004  Job:  [id number]   cc:   Ines Bloomer, M.D.  [address]  [place]  [place] [address]   Areatha Keas, M.D.  [address]  [place] [address] [place]  [place] [address]  Fax: [phone]   Meredith Staggers, M.D.  [address]  [place]  [place] [address]  Fax: [phone]   Artist Pais. Kathrynn Running, M.D.  [address] [facility]  [place]  [place] [address]  Fax: [phone]

## 2010-07-12 NOTE — Consult Note (Signed)
NAMEVIRGEN, BELLAND NO.:  [id number]   MEDICAL RECORD NO.:  [id number]          PATIENT TYPE:  INP   LOCATION:  0252                         [facility]ACILITY[facility]  [facility]   PHYSICIAN:  [name], D.D.S.DATE OF BIRTH:  11/13/1960   DATE OF CONSULTATION:  04/16/2004  DATE OF DISCHARGE:                                   CONSULTATION   [name] is a 50 year old female referred by Dr. Jama Flavors for a  dental consultation.  Patient with recent diagnosis of lung cancer with  anticipated chemo-radiation therapies.  The patient is now seen as part of a  pre-chemo-radiation therapy consultation/evaluation to rule out dental  infection, which may affect the patient's systemic health while on active  chemo-radiation therapy.   PAST MEDICAL HISTORY:  1.  Non-small-cell lung cancer.      1.  Anticipated radiation therapy with Dr. Margaretmary Dys.      2.  Anticipated chemotherapy with Dr. Jama Flavors, utilizing Taxol          and carboplatin.  **Patient currently undergoing active radiation therapy with anticipated  chemotherapy to start in approximately 7-10 days.  1.  Anemia.  2.  Rheumatoid arthritis x10 years on chronic prednisone, followed by Dr.      Phylliss Bob.  3.  Status post right cerebral artery stroke in 2001, followed by Dr.      Sharene Skeans.  4.  Status post cholecystectomy.  5.  Hypertension.   ALLERGIES/ADVERSE DRUG REACTIONS:  None known.   MEDICATIONS:  1.  Protonix 40 mg daily.  2.  Ambien 10 mg at bedtime.  3.  Moxifloxacin 400 mg every evening.  4.  Lovenox 40 units subcutaneously at bedtime.  5.  Nicotine patch daily.  6.  Morphine sulfate twice daily, as indicated.  7.  MiraLax daily.  8.  Senokot-S 2 twice daily.  9.  Tussionex 5 mg every 12 hours.  10. Iron sulfate 325 mg daily.   SOCIAL HISTORY:  The patient has one 74 year old son.  Patient currently is  not working.  Patient does have a boyfriend.  Patient denies any recent drug  use, although patient may have been in rehab previously, approximately three  years ago.  Patient does smoke approximately two packs of cigarettes a day.  Unsure of alcohol use at this time.   FAMILY HISTORY:  Positive for lung cancer in a grandfather.  Father died of  a heart attack.  Mother died of a stroke.   FUNCTIONAL ASSESSMENT:  Patient was independent for ADLs prior to this  admission.   REVIEW OF SYSTEMS:  This was reviewed from the chart, and health history  assessment forms, this admission.   DENTAL HISTORY:  Chief complaint:  Patient needs a dental history prior to  anticipated chemotherapy as part of a dental protocol to rule out dental  infection, which may affect the patient's systemic health while on  chemotherapy.   Patient currently denies acute toothache, swelling or abscesses.  Patient  indicates that she last saw a dentist approximately 2-3 years ago to have  periodontal therapy.  This is performed with Dr. Burgess Estelle, periodontist, along  with periodic treatments with her general dentist, Dr. Lindi Adie.  The patient  indicates that it has been several years since she was last seen.  Patient  denies having partial dentures of any kind.  Patient notes that she has a  loose lower left quadrant tooth and several cavities.   DENTAL/PHYSICAL EXAMINATION:  VITAL SIGNS:  Blood pressure 88/54, pulse 67,  respirations 20, temperature 98.  GENERAL:  Patient is a well-developed and well-nourished female in no acute  distress.  HEQD AND NECK:  There is no palpable lymphadenopathy.  The patient denies  acute TMJ symptoms.  INTRAORAL:There is no evidence of abscess formation within the mouth.  Patient has normal saliva.  DENTITION:  Patient with multiple missing teeth, #1, 2, 16, 17, 18, 19, 30,  31, and 32.  Patient has multiple diastemas.  PERIODONTAL:  Patient with chronic periodontitis with plaque and calculus  accumulations, gingival recession, and tooth mobility.  Patient  has moderate-  to-severe horizontal-vertical bone loss.  Patient does have a previous  history of previous periodontal therapy with a specialist (periodontist).  DENTAL CARIES:  Patient with multiple dental caries.  Would need a full  series of dental radiographs in the hospital dental clinic to evaluate  caries and come up with a definitive treatment plan.  ENDODONTIC:  Patient currently denies acute pulpitis symptoms.  Patient does  have incipient periapical pathology associated with tooth #20.  CROWNS & BRIDGES:  There are no crown or bridge restorations.  PROSTHODONTICS:  Patient denies the presence of dentures at this time.  OCCLUSION:  Patient with a poor occlusal scheme secondary to multiple  missing teeth, multiple diastemas, supereruption and drifting of the  unopposed teeth into the edentulous areas, and lack of replacement of  missing teeth with dental restorations.   INTERPRETATION:  A panoramic x-ray was taken in the Department of Radiology  on April 15, 2004.  There are multiple missing teeth.  There are multiple  diastemas.  There is moderate-to-severe horizontal-vertical bone loss.  There are multiple dental caries noted.  There is incipient periapical  pathology associated with the apex of tooth #20.  There is a poor occlusal  scheme noted.   ASSESSMENT:  1.  Plaque and calculus accumulation.  2.  Chronic periodontitis with moderate-to-severe bone loss.  3.  Generalized gingival recession.  4.  Generalized tooth mobility.  5.  Multiple dental caries.  Would need a full series of dental radiographs      to identify the extent of the dental caries.  6.  Incipient periapical pathology associated with tooth #20.  7.  Multiple missing teeth.  8.  Multiple diastemas.  9.  Superior eruption of drifting of the adipose teeth into the edentulous      areas.  10. Poor occlusal scheme.  11. No history of partial dentures. 12. History of previous periodontal therapy.  13.  Need for antibiotic pre-medication prior to invasive dental procedures.  14. Risk for bleeding due to the current Lovenox therapy with invasive      dental procedures.   PLAN/RECOMMENDATIONS:  1.  I have discussed the risks, benefits and complications of the various      treatment options with the patient in relationship to her medical and      dental conditions and anticipated chemo-radiation therapies.  We      discussed various treatment options to include multiple extractions with      alveoloplasty,  periodontal therapy, dental restorations, root canal      therapy, implant therapy, and replacement of missing teeth, as      indicated.  The patient currently agrees that a full series of dental      radiographs should be taken in the hospital dental clinic and then      overall treatment planning session will provide the patient with the      options she needs to choose from.  Minimally, the patient will need      tooth #20 extracted but may need more if the dental caries associated      with the upper molars is significant.  The patient understands this and      agrees to dental x-rays as soon as possible.  2.  Discussion of findings with Drs. Kathrynn Running and [place] as indicated.  Will      coordinate future dental procedures as indicated with respect to the      chemo/radiation therapies.  Will discuss discontinuation of the Lovenox      therapies as indicated.      RFK/MEDQ  D:  04/16/2004  T:  04/16/2004  Job:  [id number]   cc:   Artist Pais Kathrynn Running, M.D.  [address] [facility]  [place]  [place] [address]  Fax: [phone]   Lennis P. Darrold Span, M.D.  [address] [facility]  [place]  [place] [address]  Fax: [phone]

## 2010-07-12 NOTE — Consult Note (Signed)
[name], [name] NO.:  [id number]   MEDICAL RECORD NO.:  [id number]          PATIENT TYPE:  INP   LOCATION:  0252                         FACILITY:  [facility]   PHYSICIAN:  Malachi Pro. Ambrose Mantle, M.D. DATE OF BIRTH:  26-Oct-1960   DATE OF CONSULTATION:  DATE OF DISCHARGE:                                   CONSULTATION   CONSULTING PHYSICIAN:  Malachi Pro. Ambrose Mantle, M.D.   REFERRING PHYSICIAN:  Lennis P. Darrold Span, M.D.   This is a 50 year old white female, para 1-0-2-1, who was admitted to the  hospital with shortness of breath and cough and found to have squamous cell  carcinoma of the lung.  I was asked to see the patient for a GYN evaluation.  Her last menstrual period was approximately April 03, 2004, and lasted as  always 5-6 days.  She states that her periods are quite heavy on the first  and second days of her menstrual flow requiring a tampon and a pad change 5-  6 times a day.  After the second day, the flow markedly remits and  everything is over by 5-6 days.  She denies any intramenstrual bleeding.  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  She has not had a  Pap smear since 2001 and has not received any medical care since then.  She  did have a long term history of smoking and noted recently that her  shortness of breath had become increasingly worse, so she presented for  evaluation.   PAST MEDICAL HISTORY:  1.  Possible hypertension.  2.  The patient reported possible TIA and mini strokes in the past.   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   She is on no medications.   Has no drug allergies.   Family history, social history, and review of systems are as stated in her  admission history and physical exam.   PHYSICAL EXAMINATION:  NECK:  The patient's thyroid is normal.  BREASTS:  Soft without masses.  CHEST:  The anterior chest is marked for radiation.  ABDOMEN:  Soft and nontender.  No masses are palpable.  PELVIC:  Examination of the vulva reveals a possible 5-mm non raised and non  ulcerated discoloration on the perineum but I do not feel it is significant  enough to warrant biopsy.  The vagina has a normal appearing white  discharge.  The cervix is clean.  The uterus is retroverted upper limit of  normal size and slightly irregular.  The left adnexa is clear.  The right  adnexa feels as though there  is an irregular cystic mass that quite likely  represents an ovarian cyst or a hydrosalpinx.  Rectovaginal exam confirms  the above findings.   IMPRESSION:  1.  Lung cancer.  2.  Menorrhagia.  3.  Anemia.  4.  Suggestion of a minimal abnormality of the skin on the perineum.  5.  Right pelvic mass.   1.  The patient should undergo a pelvic ultrasound to evaluate the right      adnexa.  2.  Pap smear is done.  3.  I suspect the patient's anemia is of longstanding since she does not      notice any significant increase in her menstrual flow recently.  I would      treat her menorrhagia with Anaprox DS one tablet every 12 hours during      menses.  She has received IV iron.  I do not think any further      evaluation of her bleeding is necessary unless something unusual appears      on the ultrasound related to her uterus.   Thank you for asking me to see her.      TFH/MEDQ  D:  04/17/2004  T:  04/17/2004  Job:  [id number]   cc:   Lennis P. Darrold Span, M.D.  [address] [facility]  [place]  [place] [address]  Fax: [phone]

## 2010-07-12 NOTE — Discharge Summary (Signed)
[place]  Patient:    [name], [name]                        MRN: [id number] Adm. Date:  [id number] Disc. Date: [id number] Attending:  Selina Cooley CC:         Meredith Staggers, M.D.  Marlan Palau, M.D.  Armandina Gemma, M.D.   Discharge Summary  DISCHARGE DIAGNOSES: 1. Acute right middle cerebral artery stroke, probably embolic versus in situ    thrombosis, presenting with left facial droop, dysarthria, and    incoordination of the left hand.  Workup for hypercoagulable status or    source of embolism negative. 2. Rheumatoid arthritis, diagnosed at 50 years of age.  Treated with Plaquenil    and prednisone, followed by Dr. Phylliss Bob of rheumatology. 3. Tobacco use, ongoing. 4. History of hypertension, however, not taking prescribed Maxzide. 5. Chronic headaches. 6. 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 7. Question of carpal tunnel syndrome with associated with distal arm    numbness.  DISCHARGE MEDICATIONS: 1. Plaquenil 200 mg p.o. q.d. 2. Prednisone 10 mg p.o. q.d. 3. Vitamin D, calcium three times a day. 4. Amitriptyline 25 mg p.o. q.d. 5. Aspirin 325 mg p.o. q.d. 6. Maxzide 75/50 1 p.o. q.d.  FOLLOW-UP:  Dr. Dayton Scrape Monday, February 10, 2000, at 10:30 a.m.  Dr. Phylliss Bob p.r.n.  Dr. Anne Hahn of neurology, appointment to be made in one weeks time.  CONDITION ON DISCHARGE:  The patient is stable, with stable vital signs and stable neurologic deficits, persistent slight left-sided facial droop, and virtually resolved dysarthria and left hand ______ kinesis clumsiness.  PROCEDURES: 1. Head CT consistent with acute ischemic stroke of the right middle cerebral    artery distribution. 2. MRI consistent with an acute right hemispheric infarct involving the    temporal and parietal cortex as well as the insular cortex and white    matter consistent with a right MCA stroke. 3. MRI revealing occlusion of the major trunk of  the right MCA distal to the    bifurcation due to probable intracranial atherosclerotic disease. 4. Carotid Dopplers, which were negative for significant stenoses. 5. Transthoracic echocardiogram, which was essentially normal. 6. Follow-up transesophageal echocardiogram, essentially normal with a    density noted on the right atrial side of the interatrial septum consistent    with lipomatous hypertrophy of the interatrial septum.  No evidence of    vegetations or thrombus.  CONSULTATIONS: 1. Neurology, Dr. Anne Hahn. 2. Speech pathology deemed the patient safe for a regular diet and without    need for speech therapy.  LABORATORY DATA:  Hypercoagulable workup, antithrombin III, homocystine anticardiolipin studies were all normal.  Protein C and S, factor V Leiden, antiphospholipid antibodies, and prothrombin G mutation are still pending at the time of discharge.  Cholesterol total 159, LDL 84.  HISTORY OF PRESENT ILLNESS:  The patient is a 50 year old right-handed white female with past medical history significant for rheumatoid arthritis and chronic left arm numbness who was in her usual state of health until the morning of admission.  She awoke at her usual hour with a headache.  Once she arrived at her workplace, she was noted to be dysarthric, with a left facial droop and clumsiness of the left hand.  She was sent to the ER via her primary care doctors office for concerns of an acute stroke.  HOSPITAL COURSE: #1 - ACUTE RIGHT MIDDLE CEREBRAL ARTERY CEREBROVASCULAR  ACCIDENT:  The patient underwent a CT scan which revealed an ischemic CVA consistent with MCA distribution.  She was started on heparin and subsequently had an MRI, which was consistent with the above results.  The MRI/MRA also revealed an occlusion of the major trunk of her right middle cerebral artery, felt to be either due to in situ thrombosis or embolic disease.  Her neurologic deficits, essentially left facial  weakness, dysarthria, and left hand ______ kinesis, weakness, and dysmetria improved; however, did not completely resolve.  As noted above, an echocardiogram, both transthoracic and transesophageal, did not reveal any source for embolic events, and carotid studies were normal. Hypercoagulable workup was initiated.  At the time of discharge the workup has revealed normal studies, with several studies still pending.  The patient will be discharged on aspirin, with follow-up with neurology.  #2 - HISTORY OF HYPERTENSION:  The patient had mildly elevated hypertension, systolic ranging from 110-160, throughout her hospital course.  She was restarted on Maxzide with good control.  #3 - TOBACCO ABUSE:  Strongly encouraged cessation.  #4 - RHEUMATOID ARTHRITIS:  She was continued on her usual medications, and can follow up with rheumatology as needed. DD:  01/31/00 TD:  01/31/00 Job: [id number]

## 2010-07-12 NOTE — Op Note (Signed)
[name], [name] NO.:  [id number]   MEDICAL RECORD NO.:  [id number]          PATIENT TYPE:  INP   LOCATION:  0252                         FACILITY:  [facility]   PHYSICIAN:  [name], D.D.S.DATE OF BIRTH:  March 28, 1960   DATE OF PROCEDURE:  04/22/2004  DATE OF DISCHARGE:                                 OPERATIVE REPORT   SURGEON:  [name], D.D.S.   PREOPERATIVE DIAGNOSES:  1.  Lung cancer.  2.  Chemotherapy.  3.  Prechemotherapy dental protocol.  4.  Chronic apical periodontitis.  5.  Chronic periodontitis.  6.  Dental caries.  7.  Accretions.   POSTOPERATIVE DIAGNOSES:  1.  Lung cancer.  2.  Chemotherapy.  3.  Prechemotherapy dental protocol.  4.  Chronic apical periodontitis.  5.  Chronic periodontitis.  6.  Dental caries.  7.  Accretions.   OPERATION:  1.  Dental examination.  2.  Extraction of tooth numbers 3, 7, 14, 15, and 20.  3.  Three quadrants of alveoloplasty.  4.  Gross debridement of the remaining dentition.   SURGEON:  [name], D.D.S.   ASSISTANT:  Elliot Dally (Sales executive).   ANESTHESIA:  Monitored anesthesia care per the anesthesia team.   MEDICATIONS:  1.  Ampicillin 2.0 g IV prior to invasive dental procedures.  2.  Local anesthesia with total utilization of 4 Carpules each containing 36      mg of Xylocaine with 0.018 mg of epinephrine as well as 1 Carpule      containing 9 mg of Marcaine with 0.009 mg of epinephrine.   SPECIMENS:  There were five teeth which were discarded.   DRAINS:  None.   CULTURES:  None.   ESTIMATED BLOOD LOSS:  Less than 50 mL.   FLUIDS:  700 mL lactated Ringer's solution.   COMPLICATIONS:  None.   INDICATIONS FOR PROCEDURE:  The patient was recently diagnosed with lung  cancer with anticipated chemotherapy.  The patient was seen as part of a  prechemotherapy dental evaluation to rule out all infection which may affect  the patient's systemic health while  on active chemotherapy. The patient was  then evaluated and treatment planned for multiple extractions of tooth  numbers 3, 7, 14, 15, and 20 along with alveoloplasty and gross debridement  of the remaining dentition as indicated. This treatment plan was again  formulated to decrease the risk and complications associated with dental  infection from affecting the patient's systemic health while on active  chemotherapy.   FINDINGS:  The patient was examined in the operating room #4.  The teeth  were identified for extraction. The patient was noted to be affected by  chronic periodontitis, chronic apical periodontitis, tooth mobility,  accretions, and dental caries. The aforementioned necessitated the removal  of tooth numbers 3, 7, 14, 15 and 20 along with gross debridement of  remaining teeth.   DESCRIPTION OF PROCEDURE:  The patient was brought to the main operating  room #4. The patient was placed in the supine position on the operating room  table. The  patient then had monitored anesthesia care induced per the  anesthesia team. The patient was then prepped and draped in the usual manner  for a dental medicine procedure.  The oral cavity was thoroughly examined  with the findings as noted above.  No throat pack was placed at this time.  The patient was then ready for the dental medicine procedure as follows:   Local anesthesia was administered sequentially over the ninety minute  procedure with a total utilization of 4 Carpules each containing 36 mg of  Xylocaine with 0.018 mg of epinephrine as well as 1 Carpule containing 9 mg  of Marcaine with 0.009 mg of epinephrine.   The maxillary quadrants were first approached, anesthesia was delivered as  previously described.  A 15 blade incision was made from the distal of #2  through to the mesial of #4, surgical flap was then reflected. Tooth #3 was  then subluxated with a series of straight elevators. Tooth #3 was then  removed with the  53R forceps without complications.  Alveoloplasty was then  performed utilizing the rongeurs and bone file. The surgical site was then  irrigated with copious amounts of sterile saline.  The tissues were  approximated and trimmed appropriately. The surgical site was then closed  utilizing 3-0 chromic gut suture in a continuous interrupted suture  technique from the distal of #2 through the mesial of #3.  Tooth #7 was then  approached and removed with the 150 forceps without complications. The  socket was curetted and compressed appropriately. The surgical site was then  closed utilizing a figure-of-eight suture technique and 3-0 chromic gut  suture material.   The maxillary left quadrant was then approached, 15 blade incision was made  from the distal of #16 through the mesial of #14.  The surgical flap was  reflected. Tooth numbers 14 and 15 were then subluxated with  a series of  straight elevators. Tooth numbers 14 and 15 were then removed with a 53L  forceps without complications. Alveoloplasty was then performed utilizing  rongeurs and bone file. The surgical site was then irrigated with copious  amounts of sterile saline. The surgical site was then closed utilizing 3-0  chromic gut suture material in a continuous and interrupted suture technique  from the distal of #16 through the mesial of #14.   The mandibular left quadrant was then approached. Tooth #20 was subluxed and  then removed with the 151 forceps without complications. A 15 blade incision  was made from the distal of #19 through the mesial of #20.  The surgical  flap was then reflected, minor alveoloplasty was then performed utilizing  rongeurs and bone file.  The tissues were approximated and trimmed  appropriately.  The surgical site was then irrigated with copious amounts of  sterile saline. The surgical site was then closed from the distal of #19 through the mesial of #20 utilizing 3-0 chromic gut suture in a  continuous  interrupted suture technique x1.   The remaining teeth were then approached with a KAVO sonic scaler.  The  accretions were removed appropriately utilizing the KAVO sonic scaler.  The  series of curettes were then utilized to remove further accretions. The KAVO  sonic scaler was then again used to remove further accretions. At this point  in time, the entire mouth was irrigated with copious amounts of sterile  saline.  The patient was examined for complications, seeing none, dental  medicine procedure was deemed to be complete. A series  of 4 x 4 gauzes were  placed through the mouth to aid hemostasis in the extraction site area at  the request of the anesthesia team. The patient was then handed over to the  anesthesia team for final disposition.  After the appropriate amount ot  time, the patient was taken to the post anesthesia care unit with stable  vital signs and a good oxygenation level. All counts were correct for the  dental medicine procedure. The patient will be followed appropriately for  suture removal in approximately one week.      RFK/MEDQ  D:  04/22/2004  T:  04/22/2004  Job:  [id number]   cc:   Lennis P. Darrold Span, M.D.  [address] [facility]  [place]  [place] [address]  Fax: [phone]   Molli Hazard A. Kathrynn Running, M.D.  [address] [facility]  [place]  [place] [address]  Fax: [phone]

## 2010-07-12 NOTE — H&P (Signed)
[name], ACCARDO NO.:  [id number]   MEDICAL RECORD NO.:  [id number]          PATIENT TYPE:  EMS   LOCATION:  MAJO                         FACILITY:  [facility]   [facility]YSIC[facility]:  [facility] ServiceDATE OF BIRTH:  1961-02-20   DATE OF ADMISSION:  04/10/2004  DATE OF DISCHARGE:                                HISTORY & PHYSICAL   CHIEF COMPLAINT:  Shortness of breath, cough.   HISTORY OF PRESENT ILLNESS:  The patient is a 50 year old white female who  has not been to a doctor in more than 5 years with remote history of  hypertension.  According to her, a questionable history of transient  ischemic attack, CVA's who presents with a 3-4 month history of increasing  dyspnea, shortness of breath and weight loss.  She has been a long-term  smoker, started at the age of 10, smoked about two packs per day for the  last 30 some odd years.  She has noticed that over the last week or so,  however, the shortness of breath has grown increasingly worse, and she has  started to have wheezing as well as left sided chest pain.  She did make  arrangements to go to [facility] last week, but had not had time to do so,  but when she got worse she came in.   PAST MEDICAL HISTORY:  Questionable for hypertension.  Also, the patient has  self reported TIA and mini strokes.   PAST SURGICAL HISTORY:  None.   MEDICATIONS:  None.   ALLERGIES:  No drug allergies.   FAMILY HISTORY:  Positive for a grandfather who had lung cancer.  Father  died of heart attack.  Mother died of stroke.   SOCIAL HISTORY:  She has one 79 year old son.  She is not currently working.  Has a 71 year old live in boyfriend.  Denies any drug use.  Has used alcohol  in the past and does smoke about two packs of cigarettes per day.   REVIEW OF SYSTEMS:  Positive for shortness of breath, weight loss, no  nausea, vomiting or diarrhea.  No headache.  He is positive also for  weakness, fatigue.   PHYSICAL  EXAMINATION:  GENERAL APPEARANCE:  The patient is alert and  oriented, cooperative, has generalized pallor.  VITAL SIGNS:  Temperature 97, pulse 80, respirations 24, blood pressure  114/78, O2 saturation 100% on room air.  HEENT:  Oropharynx is clear.  NECK:  Supple.  She has scleral pallor.  LUNGS:  She has egophony on the left with decreased breath sounds in the  left upper lobe.  Right lung field is clear to auscultation.  HEART:  Rate is regular. S1, S2.  ABDOMEN:  Soft, nontender.  No rebound or guarding.  NECK:  No evidence of lymphadenopathy.  No supraclavicular or subclavicular  adenopathy noted.  Had questionable axillary lymphadenopathy.  EXTREMITIES:  Warm.  NEUROLOGICAL:  Nonfocal.   LABORATORY DATA:  Urinalysis within normal limits.  White count 13.9,  hemoglobin 9.4, platelets 680,000.  Sodium 135, potassium 4.0, chloride 101,  CO2 26, glucose 104, BUN 5,  creatinine 0.7, calcium 9.3, total protein 6.9,  albumin low at 2.4.  AST/ALT normal.  Alk-phos normal.  Total bilirubin  normal.  Fibrin D. dimer is 3.02.   STUDIES:  A 2-view chest x-ray showed air space opacities throughout the  left upper lobe.  CT as noted showed left upper lobe mass, left hilar and  left mediastinal mass, obstruction of the left upper lobe bronchus involving  the pericardium.  No evidence of pulmonary emboli.   IMPRESSION:  A 50 year old with long history of smoking presents with left  upper lobe mass involving the hilum, mediastinum, pericardium and is very  suspicious of malignancy given its size, location and prior history. Also,  the patient his anemia and has lost weight as well, which also is consistent  with malignancy.   PLAN:  We will admit supportive respiratory care, check iron studies and  sputum for cytology and cell count and then consult CVTS in the morning.  I  spoke with the patient and her boyfriend at length about these findings, and  they are prepared for further  information.  It may be negative in the sense  of her prognosis or outcome.      RLK/MEDQ  D:  04/10/2004  T:  04/10/2004  Job:  [id number]

## 2010-07-12 NOTE — Op Note (Signed)
NAMEHOUDA, BRAU NO.:  [id number]   MEDICAL RECORD NO.:  [id number]          PATIENT TYPE:  INP   LOCATION:  0252                         FACILITY:  [facility]   PHYSICIAN:  Ines Bloomer, M.D. DATE OF BIRTH:  1960-07-05   DATE OF PROCEDURE:  04/13/2004  DATE OF DISCHARGE:                                 OPERATIVE REPORT   PREOPERATIVE DIAGNOSIS:  Non-small cell lung cancer, left upper lobe, with  possible bronchial compression.   POSTOPERATIVE DIAGNOSIS:  Non-small cell lung cancer, left upper lobe, with  possible bronchial compression.   OPERATION PERFORMED:  Fiberoptic bronchoscopy.   After local anesthesia with Cetacaine and Xylocaine and IV sedation, the  fiberoptic bronchoscope was passed through the mouth.  The video  bronchoscope, which we would have liked to use in this case, was  unobtainable due to the scope being misplaced, so we used the standard  regular bronchoscope.  The cords were normal.  The trachea was normal.  The  carina was in the midline.  The right upper lobe, right middle lobe and  right lower lobe orifices were normal.  The right mainstem bronchus was  normal.  Going down the left mainstem bronchus, there was marked extrinsic  narrowing of the left mainstem bronchus and you could see cancer coming out  of the left upper lobe orifice, but it was not enough to cause occlusion of  the left upper lobe such that we did not think we needed to use the laser at  this particular time.  Cytologies were taken since we already had a positive  diagnosis.  Cultures were also taken.  The patient was then returned to the  recovery room in stable condition.      DPB/MEDQ  D:  04/13/2004  T:  04/15/2004  Job:  [id number]   cc:   Lennis P. Darrold Span, M.D.  [address] [facility]  [place]  [place] [address]  Fax: [phone]

## 2010-07-12 NOTE — Discharge Summary (Signed)
NAMEJONNETTE, [name]                 ACCOUNT NO.:  [id number]   MEDICAL RECORD NO.:  [id number]          PATIENT TYPE:  INP   LOCATION:  0252                         FACILITY:  [facility]   PHYSICIAN:  Lennis P. Livesay, M.D.DATE OF BIRTH:  July 26, 1960   DATE OF ADMISSION:  04/13/2004  DATE OF DISCHARGE:  04/29/2004                                 DISCHARGE SUMMARY   CHIEF COMPLAINT:  The patient is a 50 year old white female with long  history of tobacco, admitted with dyspnea and weight loss from the emergency  room where radiographic studies had shown left upper lobe mass. She was  admitted initially to [facility] as she does not have a  primary physician. The initial chest x-ray done in the emergency room  April 09, 2004 had shown airspace opacity left upper lobe, question  pneumonia or pulmonary embolus. She then had CT angio of the chest also  through the emergency room. No evidence of pulmonary emboli, centrally  located left hilar and left mediastinal mass, with obstruction of left upper  lobe bronchus and probably encasing of left pulmonary artery with nonaerated  left upper lobe. There was also thickening of the pericardium worrisome for  pericardial involvement.   HISTORY OF PRESENT ILLNESS ON ADMISSION:  Two packs a day at least smoker  since age 1. Increasing shortness of breath over the last several weeks  with some wheezing and left-sided chest pain, that chest pain actually  present for about 4 months intermittently. Some cough without hemoptysis.  She had had very poor appetite for a month prior to admission because of  drug abuse issues and depression and it was felt that most of her weight  loss was related to the poor p.o.'s.   PAST MEDICAL HISTORY:  No known allergies. History of rheumatoid arthritis  previously on prednisone by Dr. Phylliss Bob, though I do not believe she has had  this in probably over 2 years. She had right middle cerebral artery  stroke  in 2001 with neurology evaluation then. It is not in those hospital records,  but per the patient now, she had drug abuse ongoing at the time of that  stroke and apparently that was related. She has had previous cholecystectomy  and surgery on her foot. She has had heavy menstrual bleeding on a regular  schedule for several years.   FAMILY HISTORY:  Mother died of a CVA. Father died with an MI. Grandfather  had lung cancer.   SOCIAL HISTORY:  She has lived in [place] for 30 years, had been living  with a boyfriend prior to admission, but was evicted from that location  during the hospitalization. Past alcohol. She had initially denied drug  abuse to me, but subsequently told me that she has had problems with crack  cocaine and marijuana up to the time of admission. She has been through  [facility] living facility and had been off drugs for about a year, but  had resumed for, I believe, 3 years prior to this admission. 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 A 43 year old son  lives with his father in [place]. She has one brother in [place],  one brother in [place].   PHYSICAL EXAMINATION:  On admission per the hospitalist's H&P.  GENERAL: Alert, oriented, cooperative, pale.  VITAL SIGNS:  Temperature 97, pulse 80, respirations 24, blood pressure  114/78, oxygen saturation 100% on room air.  HEENT:  Oropharynx clear.  NECK:  Supple.  LUNGS:  Decreased breath sounds left upper lobe. Right lung fields clear.  HEART:  Regular and unremarkable heart sounds.  ABDOMEN:  Soft and nontender. No rebound or guarding. She does not have  palpable adenopathy.  EXTREMITIES:  Warm.  NEUROLOGIC:  Nonfocal.   LABORATORY DATA ON ADMISSION:  Urinalysis within normal limits. White count  13.9, hemoglobin 9.4, platelets 680. Sodium 135, potassium 4, chloride 101,  CO2 26, glucose 104, BUN 5, creatinine 0.7, calcium 9.3, total protein 6.9,   albumin 2.4. Liver chemistries normal.   COURSE IN THE HOSPITAL:  The patient had CT-guided needle biopsy of left  lung mass at [facility] April 11, 2004 without complications. Cytology  [phone]) from April 11, 2004:  Malignant cells consistent with non-small-  cell carcinoma, thought squamous cell. She was seen in consultation by Dr.  Dewayne Shorter with bronchoscopy performed on February 18. Cords were normal,  trachea normal, carina midline. Right lung normal and right mainstem normal.  Left mainstem had marked extrinsic narrowing with tumor into the left upper  lobe orifice without complete occlusion such that laser was not required  then. Bronchial brushings did not show malignancy. The patient was seen in  consultation by Dr. Darrold Span of medical oncology on February 17. She was  transferred to [facility] following the bronchoscopy. She was seen  by care management who assisted greatly in financial and disability  applications and assisting with the disposition and other social problems.  She was seen in consultation by Dr. Margaretmary Dys and we elected to being  with initial 10 treatments of radiation to be given with sensitizing  Taxol/carbo chemotherapy. Because of dental concerns, she was seen by Dr.  Dian Queen of dental medicine and found to have chronic apical  periodontitis, chronic periodontitis, and caries. She was taken for  extraction of tooth #3, #7, #14, #15, and #20, and cleaning done in the  operating room April 22, 2004 which she tolerated well. She was given IV  iron Dextran as she was iron deficient and hemoglobin had dropped with  hydration, I believe to 8.8 on February 17. Following the IV iron she had  improvement in her hemoglobin up to 9.8 on February 27. This has dropped a  little which was felt related to her chemotherapy and radiation and she was begun on Aranesp. She was seen in consultation by Dr. Hilbert Bible because of  history of VIN  and cervical dysplasia. Dr. Ambrose Mantle was able to do pelvic exam  on her and also had pelvic ultrasound. He plans to follow up in his office.  She was seen by Dr. Antonietta Breach of psychiatry because of the substance  abuse issues and her depression. He recommended beginning Cymbalta 20 mg  q.a.m. and this could be titrated as tolerated up to 30 mg b.i.d. He also  recommended trazodone 25 mg at bedtime. and this could be increased by 25 mg  at bedtime. as needed to a maximum of 300 mg at bedtime. Because of poor  peripheral access, a PICC line was placed.  The patient was treated with  sensitizing dose carboplatin and Taxol on February 24 and this was repeated  on April 25, 2004. Taxol was dosed at 45 mg/m2 given over 2 hours, and the  carboplatin at an [facility] of 2 equals 245 mg. Taxol total dose was 75 mg each  treatment. She was premedicated with Decadron 20 mg p.o. 2 hours prior and  this was repeated IV before the treatment. She tolerated the chemotherapy  beautifully.   The patient was also followed in hospital by the oncology chaplain and  nutritionist. She was very compliant and cooperative during the entire  hospital stay, most appreciative of all care, and very hopeful that she  would be able to improve her situation as she leaves the hospital.   The plan at this point is to repeat CT chest in radiation oncology on March  5. If there is evidence of improved aeration of the left upper lobe then she  may continue with more RT now. Otherwise, we will probably go to full-dose  chemotherapy __________.   MEDICATIONS AT DISCHARGE:  1.  Morphine __________ 30 mg twice daily.  2.  Protonix 40 mg daily.  3.  Nicotine patch 21 mg q.24h.  4.  MiraLax one capsule daily.  5.  Senokot-S two tablets b.i.d.  6.  Cymbalta 20 mg b.i.d.  7.  Trazodone 25-50 mg p.o. at bedtime.  8.  Ambien 10 mg p.o. at bedtime.  9.  Phenergan 25 mg p.o. q.4-6h. p.r.n. for nausea.  10. Morphine sulfate 15 mg one to  two q.4-6h. p.r.n. for pain.   Diet as instructed. PICC line will need to be flushed and dressings changed  on a regular schedule. Activity as tolerate. No smoking, no drugs. She will  follow up with Dr. Kathrynn Running and Dr. [facility]rold Span [facility].   FINAL DIAGNOSES:  1.  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  2.  Peripherally inserted central catheter line placed due to poor      peripheral venous access.  3.  Iron deficiency anemia post intravenous iron and now element of      chemotherapy with radiation in addition.  4.  Periodontal disease and dental problems with most pressing issues have      been address by Dr. Kristin Bruins.  5.  Gynecological followup by Dr. Ambrose Mantle.  6.  Depression as above per Dr. Jeanie Sewer.  7.  History of substance abuse.  CONDITION ON DISCHARGE:  Guarded but improved.      LPL/MEDQ  D:  04/26/2004  T:  04/26/2004  Job:  [id number]   cc:   Ines Bloomer, M.D.  [address]  [place]  [place] [address]   [name], D.D.S.  [facility]  [address]  [place]  [place] [address]  Fax: [phone]   Malachi Pro. Ambrose Mantle, M.D.  [address]  [place]  [place] [address]  Fax: [phone]   Artist Pais. Kathrynn Running, M.D.  [address] [facility]  [place]  [place] [address]  Fax: [phone]

## 2010-07-12 NOTE — Consult Note (Signed)
[place]. [facility]  Patient:    [name], [name]                        MRN: [id number] Proc. Date: 01/27/00 Adm. Date:  [id number] Attending:  Selina Cooley CC:         Meredith Staggers, M.D.  Helene Kelp, M.D.  [facility], [address].   Consultation Report  HISTORY OF PRESENT ILLNESS:  [name] is a 50 year old right-handed white female born 01/04/1961, with a history of hypertension that has been untreated.  History of rheumatoid arthritis followed by Dr. Estill Bakes, and a history of tobacco abuse.  This patient has been brought to the [facility] Emergency Room this morning after awakening with severe right frontal temporal headache and some slurred speech, clumsiness of the left arm, inability to ambulate well due to clumsiness.  The patient denies any visual field changes, nausea or vomiting.  The patient does note frequent headaches over the last couple of years, may go several days with a severe ongoing headache, and headache tends to be over the right frontal temporal region.  This patient has been placed on Maxzide but has not been taking the medication properly for treatment of blood pressure.  The patient is on prednisone for her rheumatoid arthritis as well as on Plaquenil.  Due to the underlying symptomatology, this patient was brought to the emergency room for an evaluation and was found by CT scan to have evidence of an acute evolving right frontal stroke.  Neurology was consulted for further evaluation.  The patient does note some numbness around the mouth, particularly on the left side, and some left hand numbness. The patient had some left hand numbness on and off for the last couple of months; however, was told she may have carpal tunnel syndrome.  PAST MEDICAL HISTORY:  Significant for: 1. 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 2.  History of hypertension that has been untreated. 3. History of rheumatoid arthritis. 4. History of obesity. 5. History of depression. 6. History of headache, as above. 7. History of syncope on at least one occasion in the past.  MEDICATIONS PRIOR TO ADMISSION: 1. Prednisone 10 mg q.d. 2. Plaquenil 200 mg b.i.d. 3. Amitriptyline 25 mg daily. 4. Maxzide 1 tablet a day.  ALLERGIES:  The patient states no known allergies.  HABITS:  Smokes 1 pack of cigarette per day.  Drinks about 1 beer on average daily.  SOCIAL HISTORY:  This patient is married, lives with her husband in the [place] area.  The patient is employed doing Psychologist, forensic work."  The patient has one son who is alive and well.  FAMILY MEDICAL HISTORY:  Notable for the fact that the patients mother has degenerative arthritis, hypertension.  Father has a history of hypertension. The patient has two brothers, one brother with hepatitis C, one brother who is alive and well.  REVIEW OF SYSTEMS:  Notable for no fevers or chills.  The patient does note some right temporal headache off and on.  Denies any swallowing problems with the exception of some problems today.  The patient does note some shortness of breath, denies chest pain, nausea and vomiting, bowel or bladder control problems.  PHYSICAL EXAMINATION:  VITAL SIGNS:  Blood pressure is 109/74, heart rate 64, respiratory rate 18, temperature afebrile.  GENERAL:  This patient  is a moderately obese white female who is alert and cooperative at the time of the examination.  HEENT:  Head is atraumatic.  Eyes with pupils, equal, round, reactive to light.  Discs soft, flat bilaterally.  Extraocular movements are full.  Visual fields full.  NECK:  Supple, no carotid bruits noted.  Temporal pulses were symmetric, normal.  RESPIRATORY:  Clear to auscultation and percussion.  CARDIOVASCULAR:  Regular rate and rhythm without obvious murmurs or rubs noted.  EXTREMITIES:   Without significant edema.  NEUROLOGIC:  Cranial nerves as above.  Facial symmetry appears to be present. The patient has some decreased pinprick sensation slightly on the left face and the lower aspect as compared to the right.  The patients speech is slightly dysarthric.  The patient has full visual fields.  No aphasia noted. Motor testing reveals trace weakness with grip and flexion extension of the left arm greater than the right.  Otherwise the patient has normal strength throughout.  Deep tendon reflexes are relatively symmetric.  Toes are neutral bilaterally.  If anything, the patient has a slight decrease in the left knee-jerk reflex as compared to the right.  The patient has decreased pinprick sensation on the left leg and foot and left arm and hand as compared to the right.  Decreased vibratory sensation in the left arm and leg as compared to the right.  The patient has 2+ ataxia with finger-to-nose-finger in the left arm.  There toe-to-finger bilaterally.  Good finger-to-nose-finger of the right arm.  The patient does have left pronator drift.  The patient was not ambulated.  CT of the brain shows evidence of an evolving right frontal stroke.  Blood work shows a white count of 9.4, hemoglobin 13.6, hematocrit 39.3, MCV 92.3, platelets of 282.  The patient has coagulation studies that are unremarkable and are .9.  Chemistries are not yet available.  IMPRESSION: 1. New onset right brain stroke. 2. History of headache. 3. Hypertension. 4. Rheumatoid arthritis.  This patient has risk factors for stroke including hypertension and even rheumatoid arthritis may be associated with increased risk for stroke.  The patient has headaches frequently over the past couple of years, may have migraine.  Do need to consider the possibility of a complicated migraine, but also need to rule out an embolic source for stroke, either from the heart or  from the carotid vessel.  Will pursue  work-up for this plan.  PLAN: 1. MRI of the brain. 2. MR angiogram of the intracranial vessels. 3. 2-D echocardiogram.  If this is unremarkable, consider a transesophageal    echocardiogram. 4. Carotid Doppler study. 5. Physical and occupational therapy. 6. Speech therapy for swallowing evaluation. 7. The patient may be an excellent rehabilitation candidate.  Will treat headaches with analgesics.  The patient will be on IV heparin.  We will follow the patients clinical course while in-house. DD:  01/27/00 TD:  01/27/00 Job: [id number]

## 2010-07-12 NOTE — Consult Note (Signed)
[place]. [facility]  Patient:    [name], [name]                        MRN: [id number] Adm. Date:  [id number] Attending:  Cathren Laine                          Consultation Report  CHIEF COMPLAINT:  Left hand and foot numb.  HISTORY OF PRESENT ILLNESS:  Ms. Plazola is a 50 year old right-handed woman seen in consult by Dr. Anne Hahn in the emergency room January 27, 2000 with complaints of severe right-sided headache, slurred speech, left arm clumsiness, left leg clumsiness.  Workup included an MRI which showed an acute right brain stroke temporal parietal region, insular cortex involved with major trunk right MCA occlusion by MR angiogram.  This was felt to be due to intercranial atherosclerosis.  Carotid Dopplers were negative and transthoracic echocardiogram was negative.  She was discharged in early December and made full recovery.  She notes that prior to admission with the stroke she had had pain and numbness in the left hand and foot, but did not notice that between discharge and now.  Beginning last night she again noted some left hand and foot supersensitivity with painfulness.  She denies any recurrence of the slurred speech or weakness, but says that her grip is decreased in her left hand secondary to rheumatoid problems.  REVIEW OF SYSTEMS:  Negative for headache, slurred speech, clumsiness with this episode.  She does have diffuse arthritic pain.  PAST MEDICAL HISTORY:  Significant for rheumatoid arthritis on chronic prednisone and Plaquenil, history of recent large right MCA branch stroke, hypertension.  No history of coronary artery disease or diabetes.  MEDICATIONS: 1. Prednisone 10 mg q.d. 2. Plaquenil 200 mg one or two a day, it is not clear. 3. Aspirin 325 mg q.d. 4. Amitriptyline 50 mg q.h.s. 5. Maxzide 75/50 one a day. 6. Vitamin D and C supplements.  ALLERGIES:  No known drug allergies.  SOCIAL HISTORY:  Recently quit smoking about  one month ago.  No alcohol use. She does use caffeine.  She is married with one son.  FAMILY HISTORY:  Positive for stroke, arthritis, hypertension.  PHYSICAL EXAMINATION:  VITAL SIGNS:  Temperature 98.6, pulse 75, blood pressure 126/80, respirations 18.  HEENT:  Head is normocephalic, atraumatic.  NECK:  Supple without bruits.  HEART:  Regular rate and rhythm.  LUNGS:  Clear to auscultation.  EXTREMITIES:  Without edema.  NEUROLOGIC:  Mental status:  She is awake and alert.  Normal cognition and language.  She is oriented.  Cranial nerves:  Pupils are equal and reactive. Visual fields are full to confrontation.  Disk margins sharp.  Extraocular movements are intact.  Facial sensation is normal.  Facial motor activity is normal.  Hearing is intact.  Palate is symmetric.  Tongue is midline.  On motor examination she has no drift, no satelliting, normal rapid fine movement, 5/5 strength in all extremities with normal gait.  Reflexes 2+ and symmetric with downgoing toes.  Coordination:  Finger-to-nose and heel-to-shin, tandem gait are normal.  Sensory examination shows some hypersensitivity to the skin in the left hand and forearm and left foot and shin without objective changes.  IMPRESSION:  Left-sided sensory change in the setting of left body motor stroke secondary to large right MCA branch stroke one month ago.  The examination is objectively normal now.  RECOMMENDATION:  Would go straight to an MRI scan.  If there is evidence of a new acute stroke should admit and reevaluate with transesophageal echo.  If the MRI is negative can probably discharge home, but may want to add Plavix.DD: 03/20/00 TD:  03/20/00 Job: [id number]

## 2010-11-07 ENCOUNTER — Other Ambulatory Visit: Payer: Self-pay | Admitting: Family Medicine

## 2010-11-07 ENCOUNTER — Other Ambulatory Visit: Payer: Self-pay | Admitting: Oncology

## 2010-11-07 DIAGNOSIS — Z1231 Encounter for screening mammogram for malignant neoplasm of breast: Secondary | ICD-10-CM

## 2010-11-12 ENCOUNTER — Ambulatory Visit
Admission: RE | Admit: 2010-11-12 | Discharge: 2010-11-12 | Disposition: A | Discharge status: Home / Self Care | Payer: Medicare Other | Source: Ambulatory Visit | Attending: Family Medicine | Admitting: Family Medicine

## 2010-11-12 DIAGNOSIS — Z1231 Encounter for screening mammogram for malignant neoplasm of breast: Secondary | ICD-10-CM

## 2010-11-29 LAB — RAPID URINE DRUG SCREEN, HOSP PERFORMED: Cocaine: NOT DETECTED

## 2010-11-29 LAB — CBC
HCT: 33.3 % — ABNORMAL LOW (ref 36.0–46.0)
Platelets: 316 10*3/uL (ref 150–400)
WBC: 7.7 10*3/uL (ref 4.0–10.5)

## 2010-11-29 LAB — IRON AND TIBC
Iron: 22 ug/dL — ABNORMAL LOW (ref 42–135)
Saturation Ratios: 8 % — ABNORMAL LOW (ref 20–55)
TIBC: 262 ug/dL (ref 250–470)

## 2010-11-29 LAB — BASIC METABOLIC PANEL
BUN: 10 mg/dL (ref 6–23)
GFR calc non Af Amer: >60 mL/min (ref >60)
Potassium: 5.1 mEq/L (ref 3.5–5.1)
Sodium: 146 mEq/L — ABNORMAL HIGH (ref 135–145)

## 2010-11-29 LAB — APTT: aPTT: 30 seconds (ref 24–37)

## 2010-11-29 LAB — VITAMIN B12: Vitamin B-12: 379 pg/mL (ref 211–911)

## 2010-11-29 LAB — FOLATE: Folate: >20 ng/mL

## 2010-11-29 LAB — PREGNANCY, URINE: Preg Test, Ur: NEGATIVE

## 2011-03-12 DIAGNOSIS — M069 Rheumatoid arthritis, unspecified: Secondary | ICD-10-CM | POA: Diagnosis not present

## 2011-03-12 DIAGNOSIS — M2550 Pain in unspecified joint: Secondary | ICD-10-CM | POA: Diagnosis not present

## 2011-03-12 DIAGNOSIS — Z79899 Other long term (current) drug therapy: Secondary | ICD-10-CM | POA: Diagnosis not present

## 2011-03-12 DIAGNOSIS — C3490 Malignant neoplasm of unspecified part of unspecified bronchus or lung: Secondary | ICD-10-CM | POA: Diagnosis not present

## 2011-03-20 DIAGNOSIS — Z79899 Other long term (current) drug therapy: Secondary | ICD-10-CM | POA: Diagnosis not present

## 2011-03-31 DIAGNOSIS — Z79899 Other long term (current) drug therapy: Secondary | ICD-10-CM | POA: Diagnosis not present

## 2011-04-03 ENCOUNTER — Emergency Department (HOSPITAL_COMMUNITY)
Admission: EM | Admit: 2011-04-03 | Discharge: 2011-04-03 | Disposition: A | Discharge status: Home / Self Care | Payer: No Typology Code available for payment source | Attending: Emergency Medicine | Admitting: Emergency Medicine

## 2011-04-03 ENCOUNTER — Encounter (HOSPITAL_COMMUNITY): Payer: Self-pay | Admitting: Emergency Medicine

## 2011-04-03 DIAGNOSIS — S139XXA Sprain of joints and ligaments of unspecified parts of neck, initial encounter: Secondary | ICD-10-CM | POA: Insufficient documentation

## 2011-04-03 DIAGNOSIS — S20219A Contusion of unspecified front wall of thorax, initial encounter: Secondary | ICD-10-CM | POA: Insufficient documentation

## 2011-04-03 DIAGNOSIS — Y92410 Unspecified street and highway as the place of occurrence of the external cause: Secondary | ICD-10-CM | POA: Insufficient documentation

## 2011-04-03 DIAGNOSIS — M542 Cervicalgia: Secondary | ICD-10-CM | POA: Diagnosis not present

## 2011-04-03 DIAGNOSIS — S161XXA Strain of muscle, fascia and tendon at neck level, initial encounter: Secondary | ICD-10-CM

## 2011-04-03 MED ORDER — IBUPROFEN 800 MG PO TABS: 800.0000 mg | ORAL_TABLET | Freq: Three times a day (TID) | ORAL | Status: AC

## 2011-04-03 MED ORDER — OXYCODONE-ACETAMINOPHEN 5-325 MG PO TABS
2.0000 | ORAL_TABLET | Freq: Once | ORAL | Status: AC
  Administered 2011-04-03: 1 via ORAL
  Filled 2011-04-03: qty 1

## 2011-04-03 MED ORDER — OXYCODONE-ACETAMINOPHEN 5-325 MG PO TABS: ORAL_TABLET | ORAL | Status: AC

## 2011-04-03 MED ORDER — METHOCARBAMOL 500 MG PO TABS: 500.0000 mg | ORAL_TABLET | Freq: Two times a day (BID) | ORAL | Status: AC

## 2011-04-03 MED ORDER — IBUPROFEN 800 MG PO TABS
800.0000 mg | ORAL_TABLET | Freq: Once | ORAL | Status: AC
  Administered 2011-04-03: 800 mg via ORAL
  Filled 2011-04-03: qty 1
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
## 2011-04-03 NOTE — ED Provider Notes (Signed)
Medical screening examination/treatment/procedure(s) were performed by non-physician practitioner and as supervising physician I was immediately available for consultation/collaboration.   Javonnie Illescas A. Vena Bassinger, MD 04/03/11 2318 
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
## 2011-04-10 DIAGNOSIS — M549 Dorsalgia, unspecified: Secondary | ICD-10-CM | POA: Diagnosis not present

## 2011-04-10 DIAGNOSIS — G479 Sleep disorder, unspecified: Secondary | ICD-10-CM | POA: Diagnosis not present

## 2011-04-10 DIAGNOSIS — C3490 Malignant neoplasm of unspecified part of unspecified bronchus or lung: Secondary | ICD-10-CM | POA: Diagnosis not present

## 2011-04-10 DIAGNOSIS — R109 Unspecified abdominal pain: Secondary | ICD-10-CM | POA: Diagnosis not present

## 2011-04-10 DIAGNOSIS — F329 Major depressive disorder, single episode, unspecified: Secondary | ICD-10-CM | POA: Diagnosis not present

## 2011-04-10 DIAGNOSIS — K219 Gastro-esophageal reflux disease without esophagitis: Secondary | ICD-10-CM | POA: Diagnosis not present

## 2011-04-10 DIAGNOSIS — R071 Chest pain on breathing: Secondary | ICD-10-CM | POA: Diagnosis not present

## 2011-04-10 DIAGNOSIS — M069 Rheumatoid arthritis, unspecified: Secondary | ICD-10-CM | POA: Diagnosis not present

## 2011-04-14 DIAGNOSIS — IMO0001 Reserved for inherently not codable concepts without codable children: Secondary | ICD-10-CM | POA: Diagnosis not present

## 2011-04-14 DIAGNOSIS — Z79899 Other long term (current) drug therapy: Secondary | ICD-10-CM | POA: Diagnosis not present

## 2011-04-14 DIAGNOSIS — M069 Rheumatoid arthritis, unspecified: Secondary | ICD-10-CM | POA: Diagnosis not present

## 2011-04-14 DIAGNOSIS — M2550 Pain in unspecified joint: Secondary | ICD-10-CM | POA: Diagnosis not present

## 2011-04-14 DIAGNOSIS — R7989 Other specified abnormal findings of blood chemistry: Secondary | ICD-10-CM | POA: Diagnosis not present

## 2011-04-14 DIAGNOSIS — M542 Cervicalgia: Secondary | ICD-10-CM | POA: Diagnosis not present

## 2011-04-14 DIAGNOSIS — C3490 Malignant neoplasm of unspecified part of unspecified bronchus or lung: Secondary | ICD-10-CM | POA: Diagnosis not present

## 2011-04-17 DIAGNOSIS — M542 Cervicalgia: Secondary | ICD-10-CM | POA: Diagnosis not present

## 2011-04-21 DIAGNOSIS — M542 Cervicalgia: Secondary | ICD-10-CM | POA: Diagnosis not present

## 2011-04-25 DIAGNOSIS — M542 Cervicalgia: Secondary | ICD-10-CM | POA: Diagnosis not present

## 2011-04-30 DIAGNOSIS — M542 Cervicalgia: Secondary | ICD-10-CM | POA: Diagnosis not present

## 2011-05-02 DIAGNOSIS — M542 Cervicalgia: Secondary | ICD-10-CM | POA: Diagnosis not present

## 2011-05-06 DIAGNOSIS — M542 Cervicalgia: Secondary | ICD-10-CM | POA: Diagnosis not present

## 2011-05-08 DIAGNOSIS — M542 Cervicalgia: Secondary | ICD-10-CM | POA: Diagnosis not present

## 2011-05-12 DIAGNOSIS — M069 Rheumatoid arthritis, unspecified: Secondary | ICD-10-CM | POA: Diagnosis not present

## 2011-05-12 DIAGNOSIS — Z79899 Other long term (current) drug therapy: Secondary | ICD-10-CM | POA: Diagnosis not present

## 2011-06-13 DIAGNOSIS — C3490 Malignant neoplasm of unspecified part of unspecified bronchus or lung: Secondary | ICD-10-CM | POA: Diagnosis not present

## 2011-06-13 DIAGNOSIS — Z79899 Other long term (current) drug therapy: Secondary | ICD-10-CM | POA: Diagnosis not present

## 2011-06-13 DIAGNOSIS — M2550 Pain in unspecified joint: Secondary | ICD-10-CM | POA: Diagnosis not present

## 2011-06-13 DIAGNOSIS — M069 Rheumatoid arthritis, unspecified: Secondary | ICD-10-CM | POA: Diagnosis not present

## 2011-07-02 ENCOUNTER — Ambulatory Visit (HOSPITAL_COMMUNITY): Payer: Medicare Other | Admitting: Dentistry

## 2011-07-02 ENCOUNTER — Encounter (HOSPITAL_COMMUNITY): Payer: Self-pay | Admitting: Dentistry

## 2011-07-02 VITALS — BP 118/72 | HR 73 | Temp 97.7°F

## 2011-07-02 DIAGNOSIS — Z85118 Personal history of other malignant neoplasm of bronchus and lung: Secondary | ICD-10-CM

## 2011-07-02 DIAGNOSIS — Z463 Encounter for fitting and adjustment of dental prosthetic device: Secondary | ICD-10-CM

## 2011-07-02 DIAGNOSIS — Z972 Presence of dental prosthetic device (complete) (partial): Secondary | ICD-10-CM

## 2011-07-02 NOTE — Progress Notes (Signed)
BP 118/72  Pulse 73  Temp 97.7 F (36.5 C)  [name] is a 51 year old female that presents for periodic oral exam. Patient with a history of lung cancer and is status post chemotherapy with Dr. Darrold Span. Patient had full mouth extractions with alveoloplasty on 01/27/2008 and upper lower complete dentures were inserted on 04/06/2008. Patient now presents for periodic oral examination and evaluation of dentures.  Premedication: None  Medical Hx Update: No acute changes.  ALLERGIES/ADVERSE DRUG REACTIONS: No Known Allergies  MEDICATIONS: Current Outpatient Prescriptions  Medication Sig Dispense Refill  . DULoxetine (CYMBALTA) 60 MG capsule Take 60 mg by mouth daily.      Marland Kitchen HYDROcodone-acetaminophen (VICODIN) 5-500 MG per tablet Take 1 tablet by mouth every 6 (six) hours as needed. For pain      . hydroxychloroquine (PLAQUENIL) 200 MG tablet Take 200 mg by mouth 2 (two) times daily.      Marland Kitchen leflunomide (ARAVA) 20 MG tablet Take 20 mg by mouth daily.      . methotrexate (RHEUMATREX) 2.5 MG tablet Take 10 mg by mouth once a week. Caution:Chemotherapy. Protect from light.      . Multiple Vitamins-Minerals (MULTIVITAMINS THER. W/MINERALS) TABS Take 1 tablet by mouth daily.      . nabumetone (RELAFEN) 750 MG tablet Take 750 mg by mouth 2 (two) times daily.      . traZODone (DESYREL) 100 MG tablet Take 100 mg by mouth at bedtime.       CC: " No problems with my dentures".  DENTAL EXAM: General:  patient is a well-developed, well-nourished female in no acute distress . Vitals: As above. Extraoral Exam:  There is no  lymphadenopathy.  The patient denies acute TMJ Symptoms. patient has a cold sore on the lower lip on the midline. This is been present for one week.  Intraoral  Exam:  Normal Saliva.  No signs of denture irritation or erythema. Dentition:  Edentulous. Prosthodontic: dentures are stable and retentive. Pressure indicating paste was applied to dentures and adjustments made as  needed. [place].  Calculus was removed from lower linguals. Oral hygiene instruction was provided . Occlusion:  occlusion is acceptable and centric relation and protrusive strokes.  Plan:  1. Return to clinic in one year for denture recall. All problems arise before then. 2. Suggested use of Abreva to lower lip cold sore. Over-the-counter .  [name] 07/02/2011

## 2011-08-15 DIAGNOSIS — R7989 Other specified abnormal findings of blood chemistry: Secondary | ICD-10-CM | POA: Diagnosis not present

## 2011-08-15 DIAGNOSIS — M069 Rheumatoid arthritis, unspecified: Secondary | ICD-10-CM | POA: Diagnosis not present

## 2011-08-15 DIAGNOSIS — M2550 Pain in unspecified joint: Secondary | ICD-10-CM | POA: Diagnosis not present

## 2011-08-15 DIAGNOSIS — IMO0001 Reserved for inherently not codable concepts without codable children: Secondary | ICD-10-CM | POA: Diagnosis not present

## 2011-08-15 DIAGNOSIS — Z79899 Other long term (current) drug therapy: Secondary | ICD-10-CM | POA: Diagnosis not present

## 2011-10-31 ENCOUNTER — Other Ambulatory Visit: Payer: Self-pay | Admitting: Family Medicine

## 2011-10-31 DIAGNOSIS — Z1231 Encounter for screening mammogram for malignant neoplasm of breast: Secondary | ICD-10-CM

## 2011-11-12 DIAGNOSIS — Z79899 Other long term (current) drug therapy: Secondary | ICD-10-CM | POA: Diagnosis not present

## 2011-11-12 DIAGNOSIS — M069 Rheumatoid arthritis, unspecified: Secondary | ICD-10-CM | POA: Diagnosis not present

## 2011-11-12 DIAGNOSIS — C3490 Malignant neoplasm of unspecified part of unspecified bronchus or lung: Secondary | ICD-10-CM | POA: Diagnosis not present

## 2011-11-12 DIAGNOSIS — M2550 Pain in unspecified joint: Secondary | ICD-10-CM | POA: Diagnosis not present

## 2011-11-12 DIAGNOSIS — IMO0001 Reserved for inherently not codable concepts without codable children: Secondary | ICD-10-CM | POA: Diagnosis not present

## 2011-11-25 ENCOUNTER — Ambulatory Visit: Payer: Self-pay

## 2011-12-02 ENCOUNTER — Ambulatory Visit
Admission: RE | Admit: 2011-12-02 | Discharge: 2011-12-02 | Disposition: A | Discharge status: Home / Self Care | Payer: Medicare Other | Source: Ambulatory Visit | Attending: Family Medicine | Admitting: Family Medicine

## 2011-12-02 DIAGNOSIS — Z1231 Encounter for screening mammogram for malignant neoplasm of breast: Secondary | ICD-10-CM

## 2011-12-03 DIAGNOSIS — Z0000 Encounter for general adult medical examination without abnormal findings: Secondary | ICD-10-CM | POA: Diagnosis not present

## 2011-12-03 DIAGNOSIS — Z01419 Encounter for gynecological examination (general) (routine) without abnormal findings: Secondary | ICD-10-CM | POA: Diagnosis not present

## 2011-12-03 DIAGNOSIS — Z124 Encounter for screening for malignant neoplasm of cervix: Secondary | ICD-10-CM | POA: Diagnosis not present

## 2012-02-11 DIAGNOSIS — M2550 Pain in unspecified joint: Secondary | ICD-10-CM | POA: Diagnosis not present

## 2012-02-11 DIAGNOSIS — Z79899 Other long term (current) drug therapy: Secondary | ICD-10-CM | POA: Diagnosis not present

## 2012-02-11 DIAGNOSIS — M069 Rheumatoid arthritis, unspecified: Secondary | ICD-10-CM | POA: Diagnosis not present

## 2012-03-01 DIAGNOSIS — Z79899 Other long term (current) drug therapy: Secondary | ICD-10-CM | POA: Diagnosis not present

## 2012-03-18 DIAGNOSIS — G479 Sleep disorder, unspecified: Secondary | ICD-10-CM | POA: Diagnosis not present

## 2012-03-18 DIAGNOSIS — K219 Gastro-esophageal reflux disease without esophagitis: Secondary | ICD-10-CM | POA: Diagnosis not present

## 2012-03-18 DIAGNOSIS — M069 Rheumatoid arthritis, unspecified: Secondary | ICD-10-CM | POA: Diagnosis not present

## 2012-03-18 DIAGNOSIS — IMO0001 Reserved for inherently not codable concepts without codable children: Secondary | ICD-10-CM | POA: Diagnosis not present

## 2012-03-18 DIAGNOSIS — F329 Major depressive disorder, single episode, unspecified: Secondary | ICD-10-CM | POA: Diagnosis not present

## 2012-03-18 DIAGNOSIS — I359 Nonrheumatic aortic valve disorder, unspecified: Secondary | ICD-10-CM | POA: Diagnosis not present

## 2012-03-18 DIAGNOSIS — C3490 Malignant neoplasm of unspecified part of unspecified bronchus or lung: Secondary | ICD-10-CM | POA: Diagnosis not present

## 2012-05-12 DIAGNOSIS — M2550 Pain in unspecified joint: Secondary | ICD-10-CM | POA: Diagnosis not present

## 2012-06-15 DIAGNOSIS — I359 Nonrheumatic aortic valve disorder, unspecified: Secondary | ICD-10-CM | POA: Diagnosis not present

## 2012-07-05 ENCOUNTER — Encounter (HOSPITAL_COMMUNITY): Payer: Self-pay | Admitting: Dentistry

## 2012-07-05 ENCOUNTER — Ambulatory Visit (HOSPITAL_COMMUNITY): Payer: Self-pay | Admitting: Dentistry

## 2012-07-05 VITALS — BP 120/68 | HR 85 | Temp 98.5°F

## 2012-07-05 DIAGNOSIS — K08109 Complete loss of teeth, unspecified cause, unspecified class: Secondary | ICD-10-CM

## 2012-07-05 DIAGNOSIS — Z85118 Personal history of other malignant neoplasm of bronchus and lung: Secondary | ICD-10-CM

## 2012-07-05 DIAGNOSIS — Z463 Encounter for fitting and adjustment of dental prosthetic device: Secondary | ICD-10-CM

## 2012-07-05 DIAGNOSIS — M069 Rheumatoid arthritis, unspecified: Secondary | ICD-10-CM

## 2012-07-05 NOTE — Progress Notes (Signed)
07/05/2012  Patient:            [name] Date of Birth:  26-Apr-1960 MRN:                [id number]  BP 120/68  Pulse 85  Temp(Src) 98.5 F (36.9 C) (Oral)   [name] is a 52 year old female that presents for periodic oral exam and evaluation of dentures.. Patient with a history of lung cancer and is status post chemotherapy with Dr. Darrold Span. Patient had full mouth extractions with alveoloplasty on 01/27/2008 and upper and lower complete dentures were inserted on 04/06/2008. Patient now presents for periodic oral examination and evaluation of dentures.  Past Medical History  Diagnosis Date  . History of lung cancer   . Rheumatoid arthritis   . S/P chemotherapy, time since greater than 12 weeks   . S/P radiation therapy    Past Surgical History  Procedure Laterality Date  . S/p multiple extractions  01/27/2008    Status post multiple extractions with alveoloplasty and pre-prosthetic surgery in the operating room-Dr. Kristin Bruins    ALLERGIES/ADVERSE DRUG REACTIONS: No Known Allergies  MEDICATIONS: Current Outpatient Prescriptions  Medication Sig Dispense Refill  . DULoxetine (CYMBALTA) 60 MG capsule Take 60 mg by mouth daily.      Marland Kitchen HYDROcodone-acetaminophen (VICODIN) 5-500 MG per tablet Take 1 tablet by mouth every 6 (six) hours as needed. For pain      . hydroxychloroquine (PLAQUENIL) 200 MG tablet Take 200 mg by mouth 2 (two) times daily.      Marland Kitchen leflunomide (ARAVA) 20 MG tablet Take 20 mg by mouth daily.      . methotrexate (RHEUMATREX) 2.5 MG tablet Take 10 mg by mouth once a week. Caution:Chemotherapy. Protect from light.      . Multiple Vitamins-Minerals (MULTIVITAMINS THER. W/MINERALS) TABS Take 1 tablet by mouth daily.      . nabumetone (RELAFEN) 750 MG tablet Take 750 mg by mouth 2 (two) times daily.      . traZODone (DESYREL) 100 MG tablet Take 100 mg by mouth at bedtime.       No current facility-administered medications for this visit.   CC: " No problems  with my dentures".  DENTAL EXAM: General:  patient is a well-developed, well-nourished female in no acute distress . Vitals: As above. Extraoral Exam:  There is no  lymphadenopathy.  The patient denies acute TMJ Symptoms.   Intraoral  Exam:  Normal Saliva.  No signs of denture irritation or erythema. Dentition:  Edentulous. Prosthodontic: Dentures are stable and retentive. Pressure indicating paste was applied to dentures and adjustments made as needed. [place].  Calculus was removed from lower linguals. Oral hygiene instruction was provided . Occlusion:  Ccclusion is acceptable in centric relation and protrusive strokes.  Plan:  1. Return to clinic in one year for denture recall. Call if problems arise before then.  [name], DDS 07/05/2012

## 2012-07-05 NOTE — Patient Instructions (Signed)
Return to clinic as scheduled for denture recall. Call if problems arise with the dentures. Keep dentures out if needed. Use salt water rinses as needed date healing. Dr. Kristin Bruins

## 2012-08-11 DIAGNOSIS — Z79899 Other long term (current) drug therapy: Secondary | ICD-10-CM | POA: Diagnosis not present

## 2012-08-11 DIAGNOSIS — M069 Rheumatoid arthritis, unspecified: Secondary | ICD-10-CM | POA: Diagnosis not present

## 2012-08-11 DIAGNOSIS — IMO0001 Reserved for inherently not codable concepts without codable children: Secondary | ICD-10-CM | POA: Diagnosis not present

## 2012-08-11 DIAGNOSIS — M2550 Pain in unspecified joint: Secondary | ICD-10-CM | POA: Diagnosis not present

## 2012-08-30 ENCOUNTER — Ambulatory Visit
Admission: RE | Admit: 2012-08-30 | Discharge: 2012-08-30 | Disposition: A | Discharge status: Home / Self Care | Payer: Medicare Other | Source: Ambulatory Visit | Attending: Family Medicine | Admitting: Family Medicine

## 2012-08-30 ENCOUNTER — Other Ambulatory Visit: Payer: Self-pay | Admitting: Family Medicine

## 2012-08-30 DIAGNOSIS — M549 Dorsalgia, unspecified: Secondary | ICD-10-CM

## 2012-08-30 DIAGNOSIS — M47817 Spondylosis without myelopathy or radiculopathy, lumbosacral region: Secondary | ICD-10-CM | POA: Diagnosis not present

## 2012-08-30 DIAGNOSIS — M439 Deforming dorsopathy, unspecified: Secondary | ICD-10-CM | POA: Diagnosis not present

## 2012-08-31 DIAGNOSIS — B029 Zoster without complications: Secondary | ICD-10-CM | POA: Diagnosis not present

## 2012-11-19 DIAGNOSIS — Z23 Encounter for immunization: Secondary | ICD-10-CM | POA: Diagnosis not present

## 2012-11-19 DIAGNOSIS — M069 Rheumatoid arthritis, unspecified: Secondary | ICD-10-CM | POA: Diagnosis not present

## 2012-11-19 DIAGNOSIS — M2550 Pain in unspecified joint: Secondary | ICD-10-CM | POA: Diagnosis not present

## 2012-11-19 DIAGNOSIS — IMO0001 Reserved for inherently not codable concepts without codable children: Secondary | ICD-10-CM | POA: Diagnosis not present

## 2012-11-19 DIAGNOSIS — Z79899 Other long term (current) drug therapy: Secondary | ICD-10-CM | POA: Diagnosis not present

## 2012-12-24 DIAGNOSIS — Z0000 Encounter for general adult medical examination without abnormal findings: Secondary | ICD-10-CM | POA: Diagnosis not present

## 2012-12-24 DIAGNOSIS — R87619 Unspecified abnormal cytological findings in specimens from cervix uteri: Secondary | ICD-10-CM | POA: Diagnosis not present

## 2012-12-24 DIAGNOSIS — Z124 Encounter for screening for malignant neoplasm of cervix: Secondary | ICD-10-CM | POA: Diagnosis not present

## 2013-02-21 DIAGNOSIS — M069 Rheumatoid arthritis, unspecified: Secondary | ICD-10-CM | POA: Diagnosis not present

## 2013-02-21 DIAGNOSIS — Z79899 Other long term (current) drug therapy: Secondary | ICD-10-CM | POA: Diagnosis not present

## 2013-02-21 DIAGNOSIS — IMO0001 Reserved for inherently not codable concepts without codable children: Secondary | ICD-10-CM | POA: Diagnosis not present

## 2013-02-21 DIAGNOSIS — M2550 Pain in unspecified joint: Secondary | ICD-10-CM | POA: Diagnosis not present

## 2013-04-05 DIAGNOSIS — F329 Major depressive disorder, single episode, unspecified: Secondary | ICD-10-CM | POA: Diagnosis not present

## 2013-04-05 DIAGNOSIS — M069 Rheumatoid arthritis, unspecified: Secondary | ICD-10-CM | POA: Diagnosis not present

## 2013-04-05 DIAGNOSIS — I359 Nonrheumatic aortic valve disorder, unspecified: Secondary | ICD-10-CM | POA: Diagnosis not present

## 2013-04-05 DIAGNOSIS — F3289 Other specified depressive episodes: Secondary | ICD-10-CM | POA: Diagnosis not present

## 2013-04-05 DIAGNOSIS — C3490 Malignant neoplasm of unspecified part of unspecified bronchus or lung: Secondary | ICD-10-CM | POA: Diagnosis not present

## 2013-04-05 DIAGNOSIS — K219 Gastro-esophageal reflux disease without esophagitis: Secondary | ICD-10-CM | POA: Diagnosis not present

## 2013-04-05 DIAGNOSIS — B0229 Other postherpetic nervous system involvement: Secondary | ICD-10-CM | POA: Diagnosis not present

## 2013-04-05 DIAGNOSIS — G479 Sleep disorder, unspecified: Secondary | ICD-10-CM | POA: Diagnosis not present

## 2013-04-05 DIAGNOSIS — IMO0001 Reserved for inherently not codable concepts without codable children: Secondary | ICD-10-CM | POA: Diagnosis not present

## 2013-05-25 DIAGNOSIS — IMO0001 Reserved for inherently not codable concepts without codable children: Secondary | ICD-10-CM | POA: Diagnosis not present

## 2013-05-25 DIAGNOSIS — M069 Rheumatoid arthritis, unspecified: Secondary | ICD-10-CM | POA: Diagnosis not present

## 2013-05-25 DIAGNOSIS — M2550 Pain in unspecified joint: Secondary | ICD-10-CM | POA: Diagnosis not present

## 2013-05-25 DIAGNOSIS — Z79899 Other long term (current) drug therapy: Secondary | ICD-10-CM | POA: Diagnosis not present

## 2013-05-30 DIAGNOSIS — Z79899 Other long term (current) drug therapy: Secondary | ICD-10-CM | POA: Diagnosis not present

## 2013-05-30 DIAGNOSIS — M069 Rheumatoid arthritis, unspecified: Secondary | ICD-10-CM | POA: Diagnosis not present

## 2013-06-16 ENCOUNTER — Other Ambulatory Visit: Payer: Self-pay | Admitting: General Surgery

## 2013-06-16 DIAGNOSIS — I359 Nonrheumatic aortic valve disorder, unspecified: Secondary | ICD-10-CM

## 2013-06-17 ENCOUNTER — Encounter: Payer: Self-pay | Admitting: Cardiology

## 2013-06-17 ENCOUNTER — Ambulatory Visit (HOSPITAL_COMMUNITY): Payer: Medicare Other | Attending: Cardiology | Admitting: Cardiology

## 2013-06-17 DIAGNOSIS — I359 Nonrheumatic aortic valve disorder, unspecified: Secondary | ICD-10-CM

## 2013-06-17 NOTE — Progress Notes (Signed)
Echo performed. 

## 2013-06-21 ENCOUNTER — Other Ambulatory Visit: Payer: Self-pay | Admitting: General Surgery

## 2013-06-21 DIAGNOSIS — I359 Nonrheumatic aortic valve disorder, unspecified: Secondary | ICD-10-CM

## 2013-07-05 ENCOUNTER — Encounter (HOSPITAL_COMMUNITY): Payer: Self-pay | Admitting: Dentistry

## 2013-07-07 ENCOUNTER — Encounter (HOSPITAL_COMMUNITY): Payer: Self-pay | Admitting: Dentistry

## 2013-07-07 ENCOUNTER — Ambulatory Visit (HOSPITAL_COMMUNITY): Payer: Self-pay | Admitting: Dentistry

## 2013-07-07 ENCOUNTER — Encounter (INDEPENDENT_AMBULATORY_CARE_PROVIDER_SITE_OTHER): Payer: Self-pay

## 2013-07-07 DIAGNOSIS — Z85118 Personal history of other malignant neoplasm of bronchus and lung: Secondary | ICD-10-CM

## 2013-07-07 NOTE — Patient Instructions (Signed)
Plan:  1. Patient is to rinse with salt water rinses every 2 hours while awake to aid healing of the upper right alveolar ridge.  2. Return to clinic in one year for denture recall. Call if problems arise before then. I offered fabrication of new set of dentures, but patient refuses at this time.  Dr. Enrique Sack

## 2013-07-07 NOTE — Progress Notes (Signed)
07/07/2013  Patient:            [name] Date of Birth:  1960/07/17 MRN:                [id number]  BP 129/76  Pulse 97  Temp(Src) 98.3 F (36.8 C) (Oral)   [name] is a 53 year old female that presents for periodic oral exam and evaluation of dentures.. Patient with a history of lung cancer and is status post chemotherapy with Dr. Marko Plume. Patient had full mouth extractions with alveoloplasty on 01/27/2008 and upper and lower complete dentures were inserted on 04/06/2008. Patient now presents for periodic oral examination and evaluation of dentures.  Past Medical History  Diagnosis Date  . History of lung cancer   . Rheumatoid arthritis(714.0)   . S/P chemotherapy, time since greater than 12 weeks   . S/P radiation therapy    Past Surgical History  Procedure Laterality Date  . S/p multiple extractions  01/27/2008    Status post multiple extractions with alveoloplasty and pre-prosthetic surgery in the operating room-Dr. Enrique Sack    ALLERGIES/ADVERSE DRUG REACTIONS: No Known Allergies  MEDICATIONS: Current Outpatient Prescriptions  Medication Sig Dispense Refill  . DULoxetine (CYMBALTA) 60 MG capsule Take 60 mg by mouth daily.      Marland Kitchen HYDROcodone-acetaminophen (VICODIN) 5-500 MG per tablet Take 1 tablet by mouth every 6 (six) hours as needed. For pain      . hydroxychloroquine (PLAQUENIL) 200 MG tablet Take 200 mg by mouth 2 (two) times daily.      Marland Kitchen leflunomide (ARAVA) 20 MG tablet Take 20 mg by mouth daily.      . methotrexate (RHEUMATREX) 2.5 MG tablet Take 10 mg by mouth once a week. Caution:Chemotherapy. Protect from light.      . Multiple Vitamins-Minerals (MULTIVITAMINS THER. W/MINERALS) TABS Take 1 tablet by mouth daily.      . nabumetone (RELAFEN) 750 MG tablet Take 750 mg by mouth 2 (two) times daily.      . traZODone (DESYREL) 100 MG tablet Take 100 mg by mouth at bedtime.       No current facility-administered medications for this visit.   CC: " No  problems with my dentures".  DENTAL EXAM: General:  Patient is a well-developed, well-nourished female in no acute distress . Vitals: As above. Extraoral Exam:  There is no  lymphadenopathy.  The patient denies acute TMJ Symptoms.   Intraoral  Exam:  Normal Saliva.  Slight irritation to upper right alveolar ridge. Dentition:  Edentulous. Prosthodontic: Dentures are stable and retentive. Pressure indicating paste was applied to dentures and adjustments made as needed. [place] Occlusion:  Occlusion is acceptable in centric relation and protrusive strokes. Patient has worn denture teeth. Saddle areas of upper and lower dentures were adjusted. [place]).  Plan:  1. Patient is to rinse with salt water rinses every 2 hours while awake to aid healing of the upper right alveolar ridge.  2. Return to clinic in one year for denture recall. Call if problems arise before then. I offered fabrication of new set of dentures, but patient refuses at this time.   Lenn Cal, DDS

## 2013-09-07 DIAGNOSIS — M2550 Pain in unspecified joint: Secondary | ICD-10-CM | POA: Diagnosis not present

## 2013-09-07 DIAGNOSIS — M069 Rheumatoid arthritis, unspecified: Secondary | ICD-10-CM | POA: Diagnosis not present

## 2013-09-07 DIAGNOSIS — Z79899 Other long term (current) drug therapy: Secondary | ICD-10-CM | POA: Diagnosis not present

## 2013-09-07 DIAGNOSIS — IMO0001 Reserved for inherently not codable concepts without codable children: Secondary | ICD-10-CM | POA: Diagnosis not present

## 2013-11-29 DIAGNOSIS — I359 Nonrheumatic aortic valve disorder, unspecified: Secondary | ICD-10-CM | POA: Diagnosis not present

## 2013-11-29 DIAGNOSIS — M797 Fibromyalgia: Secondary | ICD-10-CM | POA: Diagnosis not present

## 2013-11-29 DIAGNOSIS — G4700 Insomnia, unspecified: Secondary | ICD-10-CM | POA: Diagnosis not present

## 2013-11-29 DIAGNOSIS — M069 Rheumatoid arthritis, unspecified: Secondary | ICD-10-CM | POA: Diagnosis not present

## 2013-11-29 DIAGNOSIS — Z85118 Personal history of other malignant neoplasm of bronchus and lung: Secondary | ICD-10-CM | POA: Diagnosis not present

## 2013-11-29 DIAGNOSIS — B0229 Other postherpetic nervous system involvement: Secondary | ICD-10-CM | POA: Diagnosis not present

## 2013-11-29 DIAGNOSIS — Z23 Encounter for immunization: Secondary | ICD-10-CM | POA: Diagnosis not present

## 2013-11-29 DIAGNOSIS — K219 Gastro-esophageal reflux disease without esophagitis: Secondary | ICD-10-CM | POA: Diagnosis not present

## 2013-11-29 DIAGNOSIS — F329 Major depressive disorder, single episode, unspecified: Secondary | ICD-10-CM | POA: Diagnosis not present

## 2013-12-06 DIAGNOSIS — M0609 Rheumatoid arthritis without rheumatoid factor, multiple sites: Secondary | ICD-10-CM | POA: Diagnosis not present

## 2013-12-06 DIAGNOSIS — M2550 Pain in unspecified joint: Secondary | ICD-10-CM | POA: Diagnosis not present

## 2013-12-06 DIAGNOSIS — Z79899 Other long term (current) drug therapy: Secondary | ICD-10-CM | POA: Diagnosis not present

## 2013-12-06 DIAGNOSIS — M797 Fibromyalgia: Secondary | ICD-10-CM | POA: Diagnosis not present

## 2014-02-10 DIAGNOSIS — Z124 Encounter for screening for malignant neoplasm of cervix: Secondary | ICD-10-CM | POA: Diagnosis not present

## 2014-02-10 DIAGNOSIS — Z01419 Encounter for gynecological examination (general) (routine) without abnormal findings: Secondary | ICD-10-CM | POA: Diagnosis not present

## 2014-02-10 DIAGNOSIS — Z1231 Encounter for screening mammogram for malignant neoplasm of breast: Secondary | ICD-10-CM | POA: Diagnosis not present

## 2014-02-10 DIAGNOSIS — Z0000 Encounter for general adult medical examination without abnormal findings: Secondary | ICD-10-CM | POA: Diagnosis not present

## 2014-03-31 DIAGNOSIS — Z79899 Other long term (current) drug therapy: Secondary | ICD-10-CM | POA: Diagnosis not present

## 2014-03-31 DIAGNOSIS — M0609 Rheumatoid arthritis without rheumatoid factor, multiple sites: Secondary | ICD-10-CM | POA: Diagnosis not present

## 2014-03-31 DIAGNOSIS — M797 Fibromyalgia: Secondary | ICD-10-CM | POA: Diagnosis not present

## 2014-03-31 DIAGNOSIS — M2550 Pain in unspecified joint: Secondary | ICD-10-CM | POA: Diagnosis not present

## 2014-06-02 DIAGNOSIS — K219 Gastro-esophageal reflux disease without esophagitis: Secondary | ICD-10-CM | POA: Diagnosis not present

## 2014-06-02 DIAGNOSIS — G4700 Insomnia, unspecified: Secondary | ICD-10-CM | POA: Diagnosis not present

## 2014-06-02 DIAGNOSIS — F329 Major depressive disorder, single episode, unspecified: Secondary | ICD-10-CM | POA: Diagnosis not present

## 2014-06-02 DIAGNOSIS — M797 Fibromyalgia: Secondary | ICD-10-CM | POA: Diagnosis not present

## 2014-06-02 DIAGNOSIS — Z85118 Personal history of other malignant neoplasm of bronchus and lung: Secondary | ICD-10-CM | POA: Diagnosis not present

## 2014-06-02 DIAGNOSIS — I359 Nonrheumatic aortic valve disorder, unspecified: Secondary | ICD-10-CM | POA: Diagnosis not present

## 2014-06-02 DIAGNOSIS — M069 Rheumatoid arthritis, unspecified: Secondary | ICD-10-CM | POA: Diagnosis not present

## 2014-06-02 DIAGNOSIS — M542 Cervicalgia: Secondary | ICD-10-CM | POA: Diagnosis not present

## 2014-06-02 DIAGNOSIS — B0229 Other postherpetic nervous system involvement: Secondary | ICD-10-CM | POA: Diagnosis not present

## 2014-06-07 DIAGNOSIS — M542 Cervicalgia: Secondary | ICD-10-CM | POA: Diagnosis not present

## 2014-06-07 DIAGNOSIS — M5032 Other cervical disc degeneration, mid-cervical region: Secondary | ICD-10-CM | POA: Diagnosis not present

## 2014-06-29 DIAGNOSIS — Z79899 Other long term (current) drug therapy: Secondary | ICD-10-CM | POA: Diagnosis not present

## 2014-06-29 DIAGNOSIS — M2550 Pain in unspecified joint: Secondary | ICD-10-CM | POA: Diagnosis not present

## 2014-06-29 DIAGNOSIS — M797 Fibromyalgia: Secondary | ICD-10-CM | POA: Diagnosis not present

## 2014-06-29 DIAGNOSIS — M0609 Rheumatoid arthritis without rheumatoid factor, multiple sites: Secondary | ICD-10-CM | POA: Diagnosis not present

## 2014-07-10 ENCOUNTER — Ambulatory Visit (HOSPITAL_COMMUNITY): Payer: Self-pay | Admitting: Dentistry

## 2014-07-17 ENCOUNTER — Ambulatory Visit (HOSPITAL_COMMUNITY): Payer: Self-pay | Admitting: Dentistry

## 2014-07-17 ENCOUNTER — Encounter (HOSPITAL_COMMUNITY): Payer: Self-pay | Admitting: Dentistry

## 2014-07-17 ENCOUNTER — Encounter (INDEPENDENT_AMBULATORY_CARE_PROVIDER_SITE_OTHER): Payer: Self-pay

## 2014-07-17 DIAGNOSIS — Z85118 Personal history of other malignant neoplasm of bronchus and lung: Secondary | ICD-10-CM | POA: Diagnosis not present

## 2014-07-17 NOTE — Patient Instructions (Addendum)
Plan:  1. Patient is to apply antibiotic to the corners of her mouth 4 times daily after meals and at bedtime.  2. Patient is to keep dentures out if sore spots arise. Use salt water rinses as needed.   3. Return to clinic in one year for denture recall.  4. Discussed fabrication of new upper and lower complete dentures. Patient refused at this time.  Call if problems arise before then.  Dr. Enrique Sack   Instructions for Denture Use and Care  Congratulations, you are on the way to oral rehabilitation!  You have just received a new set of complete or partial dentures.  These prostheses will help to improve both your appearance and chewing ability.  These instructions will help you get adjusted to your dentures as well as care for them properly.  Please read these instructions carefully and completely as soon as you get home.  If you or your caregiver have any questions please notify the [facility] at [phone].  HOW YOUR DENTURES LOOK AND FEEL Soon after you begin wearing your dentures, you may feel that your dentures are too large or even loose.  As our mouth and facial muscles become accustomed to the dentures, these feelings will go away.  You also may feel that you are salivating more than you normally do.  This feeling should go away as you get used to having the dentures in your mouth.  You may bite your cheek or your tongue; this will eventually resolve itself as you wear your dentures.  Some soreness is to be expected, but you should not hurt.  If your mouth hurts, call your dentist.  A denture adhesive may occasionally be necessary to hold your dentures in place more securely.  The dentist will let you know when one is recommended for you.  SPEAKING Wearing dentures will change the sound of your voice initially.  This will be noticed by you more than anyone else.  Bite and swallow before you speak, in order to place your dentures in position so that you may speak more  clearly.  Practice speaking by reading aloud or counting from 1 to 100 very slowly and distinctly.  After some practice your mouth will become accustomed to your dentures and you will speak more clearly.  EATING Chewing will definitely be different after you receive your dentures.  With a little practice and patience you should be able to eat just about any kind of food.  Begin by eating small quantities of food that are cut into small pieces.  Star with soft foods such as eggs, cooked vegetables, or puddings.  As you gain confidence advance  Your diet to whatever texture foods you can tolerate.  DENTURE CARE Dentures can collect plaque and calculus much the same as natural teeth can.  If not removed on a regular basis, your dentures will not look or feel clean, and you will experience denture odor.  It is very important that you remove your dentures at bedtime and clean them thoroughly.  You should: 1. Clean your dentures over a sink full of water so if dropped, breakage will be prevented. 2. Rinse your dentures with cool water to remove any large food particles. 3. Use soap and water or a denture cleanser or paste to clean the dentures.  Do not use regular toothpaste as it may abrade the denture base or teeth. 4. Use a moistened denture brush to clean all surfaces (inside and outside). 5. Rinse thoroughly  to remove any remaining soap or denture cleanser. 6. Use a soft bristle toothbrush to gently brush any natural teeth, gums, tongue, and palate at bedtime and before reinserting your dentures. 7. Do not sleep with your dentures in your mouth at night.  Remove your dentures and soak them overnight in a denture cup filled with water or denture solution as recommended by your dentist.  This routine will become second nature and will increase the life and comfort of your dentures.  Please do not try to adjust these dentures yourself; you could damage them.  FOLLOW-UP You should call or make an  appointment with your dentist.  Your dentist would like to see you at least once a year for a check-up and examination.

## 2014-07-17 NOTE — Progress Notes (Signed)
07/17/2014  Patient:            [name] Date of Birth:  September 25, 1960 MRN:                [id number]  BP 159/90 mmHg  Pulse 96  Temp(Src) 97.8 F (36.6 C) (Oral)   [name] is a 54 year old female that presents for periodic oral exam and evaluation of dentures.. Patient with a history of lung cancer and is status post chemotherapy with Dr. Marko Plume. Patient had full mouth extractions with alveoloplasty on 01/27/2008 and upper and lower complete dentures were inserted on 04/06/2008. Patient now presents for periodic oral examination and evaluation of dentures.  There are no active problems to display for this patient.   Past Medical History  Diagnosis Date  . History of lung cancer   . Rheumatoid arthritis(714.0)   . S/P chemotherapy, time since greater than 12 weeks   . S/P radiation therapy    Past Surgical History  Procedure Laterality Date  . S/p multiple extractions  01/27/2008    Status post multiple extractions with alveoloplasty and pre-prosthetic surgery in the operating room-Dr. Enrique Sack    ALLERGIES/ADVERSE DRUG REACTIONS: No Known Allergies  MEDICATIONS: Current Outpatient Prescriptions  Medication Sig Dispense Refill  . DULoxetine (CYMBALTA) 60 MG capsule Take 60 mg by mouth daily.    Marland Kitchen HYDROcodone-acetaminophen (VICODIN) 5-500 MG per tablet Take 1 tablet by mouth every 6 (six) hours as needed. For pain    . hydroxychloroquine (PLAQUENIL) 200 MG tablet Take 200 mg by mouth 2 (two) times daily.    Marland Kitchen leflunomide (ARAVA) 20 MG tablet Take 20 mg by mouth daily.    . methotrexate (RHEUMATREX) 2.5 MG tablet Take 10 mg by mouth once a week. Caution:Chemotherapy. Protect from light.    . Multiple Vitamins-Minerals (MULTIVITAMINS THER. W/MINERALS) TABS Take 1 tablet by mouth daily.    . nabumetone (RELAFEN) 750 MG tablet Take 750 mg by mouth 2 (two) times daily.    . traZODone (DESYREL) 100 MG tablet Take 100 mg by mouth at bedtime.     No current  facility-administered medications for this visit.   CC: " No problems with my dentures".  DENTAL EXAM: General:  Patient is a well-developed, well-nourished female in no acute distress . Vitals: BP 159/90 mmHg  Pulse 96  Temp(Src) 97.8 F (36.6 C) (Oral) Extraoral Exam:  There is no  lymphadenopathy.  The patient denies acute TMJ Symptoms.  The patient has bilateral angular cheilitis. Intraoral  Exam:  Normal Saliva.  No evidence of denture irritation or erythema.  Dentition:  Edentulous. Prosthodontic: Dentures are stable and retentive. Pressure indicating paste was applied to dentures and adjustments made as needed. [place] Occlusion:  Occlusion is acceptable in centric relation and protrusive strokes. Patient has worn denture teeth with loss of some vertical dimension.   Plan:  1. Patient is to apply antibiotic to the corners of her mouth 4 times daily after meals and at bedtime.  2. Patient is to keep dentures out if sore spots arise. Use salt water rinses as needed.   3. Return to clinic in one year for denture recall.  4. Discussed fabrication of new upper and lower complete dentures. Patient refused at this time.  Call if problems arise before then.    Lenn Cal, DDS

## 2014-07-28 ENCOUNTER — Encounter: Payer: Self-pay | Admitting: Cardiology

## 2014-07-28 ENCOUNTER — Ambulatory Visit (INDEPENDENT_AMBULATORY_CARE_PROVIDER_SITE_OTHER): Payer: Medicare Other | Admitting: Cardiology

## 2014-07-28 VITALS — BP 160/94 | HR 82 | Ht 65.0 in | Wt 227.6 lb

## 2014-07-28 DIAGNOSIS — R9431 Abnormal electrocardiogram [ECG] [EKG]: Secondary | ICD-10-CM | POA: Diagnosis not present

## 2014-07-28 DIAGNOSIS — I10 Essential (primary) hypertension: Secondary | ICD-10-CM | POA: Diagnosis not present

## 2014-07-28 DIAGNOSIS — I370 Nonrheumatic pulmonary valve stenosis: Secondary | ICD-10-CM | POA: Diagnosis not present

## 2014-07-28 DIAGNOSIS — I351 Nonrheumatic aortic (valve) insufficiency: Secondary | ICD-10-CM

## 2014-07-28 HISTORY — DX: Nonrheumatic aortic (valve) insufficiency: I35.1

## 2014-07-28 HISTORY — DX: Nonrheumatic pulmonary valve stenosis: I37.0

## 2014-07-28 HISTORY — DX: Essential (primary) hypertension: I10

## 2014-07-28 NOTE — Progress Notes (Signed)
Cardiology Office Note   Date:  07/28/2014   ID:  [name]  PCP:  Chelsea Kroner, MD    Chief Complaint  Patient presents with  . New Evaluation    aortic valve disorder      History of Present Illness: [name] is a 54 y.o. female who presents for evaluation of mild to moderate AI and very mild pulmonic stenosis.  She has a history of 2D echo in 2009 showing mild AVSC with mild to moderate AI and minimal PS.  She has a history of lung CA s/p chemo and radiation, HTN, RA, CVA and fibromyalgia.  She denies any chest pain, LE edema,  palpitations or syncope. She has chronic SOB with DOE that she attributes to being overweight.  She occasionally will have some problems with dizziness but resolves when she eats.      Past Medical History  Diagnosis Date  . History of lung cancer   . Rheumatoid arthritis(714.0)   . S/P chemotherapy, time since greater than 12 weeks   . S/P radiation therapy   . Aortic insufficiency 07/28/2014    Mild to moderate  . Pulmonic stenosis 07/28/2014  . Benign essential HTN 07/28/2014    Past Surgical History  Procedure Laterality Date  . S/p multiple extractions  01/27/2008    Status post multiple extractions with alveoloplasty and pre-prosthetic surgery in the operating room-Dr. Enrique Sharp     Current Outpatient Prescriptions  Medication Sig Dispense Refill  . DULoxetine (CYMBALTA) 60 MG capsule Take 60 mg by mouth daily.    Marland Kitchen gabapentin (NEURONTIN) 300 MG capsule Take 300 mg by mouth 2 (two) times daily.  5  . HYDROcodone-acetaminophen (VICODIN) 5-500 MG per tablet Take 1 tablet by mouth every 6 (six) hours as needed. For pain    . hydroxychloroquine (PLAQUENIL) 200 MG tablet Take 200 mg by mouth 2 (two) times daily.    Marland Kitchen leflunomide (ARAVA) 20 MG tablet Take 20 mg by mouth daily.    . methotrexate (RHEUMATREX) 2.5 MG tablet Take 10 mg by mouth once a week. Caution:Chemotherapy. Protect from  light.    . Multiple Vitamins-Minerals (MULTIVITAMINS THER. W/MINERALS) TABS Take 1 tablet by mouth daily.    . nabumetone (RELAFEN) 750 MG tablet Take 750 mg by mouth 2 (two) times daily.    Marland Kitchen tiZANidine (ZANAFLEX) 4 MG tablet Take 4 mg by mouth as needed. For muscle spasms  2  . traZODone (DESYREL) 100 MG tablet Take 100 mg by mouth at bedtime.     No current facility-administered medications for this visit.    Allergies:   Review of patient's allergies indicates no known allergies.    Social History:  The patient  reports that she quit smoking about 10 years ago. Her smoking use included Cigarettes. She has a 45 pack-year smoking history. She has never used smokeless tobacco. She reports that she does not drink alcohol.   Family History:  The patient's family history includes Heart attack in her father; Liver disease in her father; Stroke in her mother.    ROS:  Please see the history of present illness.   Otherwise, review of systems are positive for none.   All other systems are reviewed and negative.    PHYSICAL EXAM: VS:  BP 160/94 mmHg  Pulse 82  Ht '5\' 5"'$  (1.651 m)  Wt 227 lb  9.6 oz (103.239 kg)  BMI 37.87 kg/m2 , BMI Body mass index is 37.87 kg/(m^2). GEN: Well nourished, well developed, in no acute distress HEENT: normal Neck: no JVD, carotid bruits, or masses Cardiac: RRR; no rubs, or gallops,no edema.  2/6 SM at RUSB and 1/6 diastolic murmur at RUSB to LLSB Respiratory:  clear to auscultation bilaterally, normal work of breathing GI: soft, nontender, nondistended, + BS MS: no deformity or atrophy Skin: warm and dry, no rash Neuro:  Strength and sensation are intact Psych: euthymic mood, full affect   EKG:  EKG is ordered today. The ekg ordered today demonstrates NSR with inferior infarct  And anterolateral infarct, LAE   Recent Labs: No results found for requested labs within last 365 days.    Lipid Panel No results found for: CHOL, TRIG, HDL, CHOLHDL, VLDL,  LDLCALC, LDLDIRECT    Wt Readings from Last 3 Encounters:  07/28/14 227 lb 9.6 oz (103.239 kg)       ASSESSMENT AND PLAN:  1.  Mild to moderate AI by echo 05/2013 - will repeat echo to assess for progression.  She is asymptomatic but does have a 2/6 SM and 1/6 Diastolic murmur at RUSB to LLSB 2.  Mild pulmonic stenosis 3.  HTN - elevated on exam today but normal at last PCP OV.   4.  Abnormal EKG with inferior and anterolateral infarct pattern. I will get a stress myoview to assess for ischemia and 2D echo to rule out wall motion abnormality   Current medicines are reviewed at length with the patient today.  The patient does not have concerns regarding medicines.  The following changes have been made:  no change  Labs/ tests ordered today: See above Assessment and Plan No orders of the defined types were placed in this encounter.     Disposition:   FU with me in 1 year  Signed, Chelsea Margarita, MD  07/28/2014 11:40 AM    [facility] [address], [place], [place]  [address] Phone: [phone]; Fax: [phone]

## 2014-07-28 NOTE — Patient Instructions (Signed)
Medication Instructions:  Your physician recommends that you continue on your current medications as directed. Please refer to the Current Medication list given to you today.   Labwork: None  Testing/Procedures: Your physician has requested that you have an echocardiogram. Echocardiography is a painless test that uses sound waves to create images of your heart. It provides your doctor with information about the size and shape of your heart and how well your heart's chambers and valves are working. This procedure takes approximately one hour. There are no restrictions for this procedure.   Dr. Radford Pax recommends you have an [place].  Follow-Up: Your physician wants you to follow-up in: 1 year with Dr. Radford Pax. You will receive a reminder letter in the mail two months in advance. If you don't receive a letter, please call our office to schedule the follow-up appointment.  Any Other Special Instructions Will Be Listed Below (If Applicable).

## 2014-08-10 ENCOUNTER — Telehealth (HOSPITAL_COMMUNITY): Payer: Self-pay | Admitting: *Deleted

## 2014-08-10 NOTE — Telephone Encounter (Signed)
Patient given detailed instructions per Myocardial Perfusion Study Information Sheet for test on 08/15/14 at 11:45. Patient Notified to arrive 15 minutes early, and that it is imperative to arrive on time for appointment to keep from having the test rescheduled. Patient verbalized understanding. Hubbard Robinson, RN

## 2014-08-11 DIAGNOSIS — Z79899 Other long term (current) drug therapy: Secondary | ICD-10-CM | POA: Diagnosis not present

## 2014-08-11 DIAGNOSIS — M069 Rheumatoid arthritis, unspecified: Secondary | ICD-10-CM | POA: Diagnosis not present

## 2014-08-15 ENCOUNTER — Ambulatory Visit (HOSPITAL_COMMUNITY): Payer: Medicare Other | Attending: Cardiology

## 2014-08-15 ENCOUNTER — Other Ambulatory Visit: Payer: Self-pay

## 2014-08-15 ENCOUNTER — Ambulatory Visit (HOSPITAL_BASED_OUTPATIENT_CLINIC_OR_DEPARTMENT_OTHER): Payer: Medicare Other

## 2014-08-15 VITALS — Ht 65.0 in | Wt 227.0 lb

## 2014-08-15 DIAGNOSIS — G4440 Drug-induced headache, not elsewhere classified, not intractable: Secondary | ICD-10-CM

## 2014-08-15 DIAGNOSIS — I351 Nonrheumatic aortic (valve) insufficiency: Secondary | ICD-10-CM | POA: Insufficient documentation

## 2014-08-15 DIAGNOSIS — I517 Cardiomegaly: Secondary | ICD-10-CM | POA: Insufficient documentation

## 2014-08-15 DIAGNOSIS — R9431 Abnormal electrocardiogram [ECG] [EKG]: Secondary | ICD-10-CM | POA: Diagnosis not present

## 2014-08-15 DIAGNOSIS — R0602 Shortness of breath: Secondary | ICD-10-CM | POA: Diagnosis not present

## 2014-08-15 MED ORDER — TECHNETIUM TC 99M SESTAMIBI GENERIC - CARDIOLITE
33.0000 | Freq: Once | INTRAVENOUS | Status: AC | PRN
  Administered 2014-08-15: 33 via INTRAVENOUS

## 2014-08-16 ENCOUNTER — Ambulatory Visit (HOSPITAL_COMMUNITY): Payer: Medicare Other | Attending: Cardiology

## 2014-08-16 DIAGNOSIS — R0609 Other forms of dyspnea: Secondary | ICD-10-CM | POA: Diagnosis not present

## 2014-08-16 DIAGNOSIS — R079 Chest pain, unspecified: Secondary | ICD-10-CM | POA: Insufficient documentation

## 2014-08-16 DIAGNOSIS — R9431 Abnormal electrocardiogram [ECG] [EKG]: Secondary | ICD-10-CM | POA: Diagnosis not present

## 2014-08-16 LAB — MYOCARDIAL PERFUSION IMAGING
CHL CUP RESTING HR STRESS: 81 {beats}/min
LV dias vol: 97 mL
LVSYSVOL: 47 mL
Peak HR: 112 {beats}/min
RATE: 0.22
SDS: 1
SRS: 4
SSS: 5
TID: 0.96

## 2014-08-16 MED ORDER — REGADENOSON 0.4 MG/5ML IV SOLN
0.4000 mg | Freq: Once | INTRAVENOUS | Status: AC
  Administered 2014-08-16: 0.4 mg via INTRAVENOUS

## 2014-08-16 MED ORDER — TECHNETIUM TC 99M SESTAMIBI GENERIC - CARDIOLITE
33.0000 | Freq: Once | INTRAVENOUS | Status: AC | PRN
  Administered 2014-08-16: 33 via INTRAVENOUS

## 2014-08-16 MED ORDER — AMINOPHYLLINE 25 MG/ML IV SOLN
75.0000 mg | Freq: Once | INTRAVENOUS | Status: AC
  Administered 2014-08-16: 75 mg via INTRAVENOUS

## 2014-10-09 DIAGNOSIS — M069 Rheumatoid arthritis, unspecified: Secondary | ICD-10-CM | POA: Diagnosis not present

## 2014-10-09 DIAGNOSIS — Z79899 Other long term (current) drug therapy: Secondary | ICD-10-CM | POA: Diagnosis not present

## 2014-10-09 DIAGNOSIS — M2550 Pain in unspecified joint: Secondary | ICD-10-CM | POA: Diagnosis not present

## 2015-01-04 DIAGNOSIS — M797 Fibromyalgia: Secondary | ICD-10-CM | POA: Diagnosis not present

## 2015-01-04 DIAGNOSIS — M069 Rheumatoid arthritis, unspecified: Secondary | ICD-10-CM | POA: Diagnosis not present

## 2015-01-04 DIAGNOSIS — G4700 Insomnia, unspecified: Secondary | ICD-10-CM | POA: Diagnosis not present

## 2015-01-04 DIAGNOSIS — M542 Cervicalgia: Secondary | ICD-10-CM | POA: Diagnosis not present

## 2015-01-04 DIAGNOSIS — Z23 Encounter for immunization: Secondary | ICD-10-CM | POA: Diagnosis not present

## 2015-01-04 DIAGNOSIS — I359 Nonrheumatic aortic valve disorder, unspecified: Secondary | ICD-10-CM | POA: Diagnosis not present

## 2015-01-04 DIAGNOSIS — B0229 Other postherpetic nervous system involvement: Secondary | ICD-10-CM | POA: Diagnosis not present

## 2015-01-04 DIAGNOSIS — K219 Gastro-esophageal reflux disease without esophagitis: Secondary | ICD-10-CM | POA: Diagnosis not present

## 2015-01-04 DIAGNOSIS — M545 Low back pain: Secondary | ICD-10-CM | POA: Diagnosis not present

## 2015-01-04 DIAGNOSIS — Z85118 Personal history of other malignant neoplasm of bronchus and lung: Secondary | ICD-10-CM | POA: Diagnosis not present

## 2015-01-04 DIAGNOSIS — F329 Major depressive disorder, single episode, unspecified: Secondary | ICD-10-CM | POA: Diagnosis not present

## 2015-01-09 DIAGNOSIS — M2550 Pain in unspecified joint: Secondary | ICD-10-CM | POA: Diagnosis not present

## 2015-01-09 DIAGNOSIS — M0609 Rheumatoid arthritis without rheumatoid factor, multiple sites: Secondary | ICD-10-CM | POA: Diagnosis not present

## 2015-01-09 DIAGNOSIS — Z79899 Other long term (current) drug therapy: Secondary | ICD-10-CM | POA: Diagnosis not present

## 2015-02-12 DIAGNOSIS — Z1151 Encounter for screening for human papillomavirus (HPV): Secondary | ICD-10-CM | POA: Diagnosis not present

## 2015-02-12 DIAGNOSIS — R87610 Atypical squamous cells of undetermined significance on cytologic smear of cervix (ASC-US): Secondary | ICD-10-CM | POA: Diagnosis not present

## 2015-02-12 DIAGNOSIS — Z01419 Encounter for gynecological examination (general) (routine) without abnormal findings: Secondary | ICD-10-CM | POA: Diagnosis not present

## 2015-02-12 DIAGNOSIS — Z130 Encounter for screening for diseases of the blood and blood-forming organs and certain disorders involving the immune mechanism: Secondary | ICD-10-CM | POA: Diagnosis not present

## 2015-02-12 DIAGNOSIS — Z1231 Encounter for screening mammogram for malignant neoplasm of breast: Secondary | ICD-10-CM | POA: Diagnosis not present

## 2015-02-12 DIAGNOSIS — Z86008 Personal history of in-situ neoplasm of other site: Secondary | ICD-10-CM | POA: Diagnosis not present

## 2015-02-12 DIAGNOSIS — Z124 Encounter for screening for malignant neoplasm of cervix: Secondary | ICD-10-CM | POA: Diagnosis not present

## 2015-02-12 DIAGNOSIS — Z1389 Encounter for screening for other disorder: Secondary | ICD-10-CM | POA: Diagnosis not present

## 2015-02-12 DIAGNOSIS — R809 Proteinuria, unspecified: Secondary | ICD-10-CM | POA: Diagnosis not present

## 2015-02-20 DIAGNOSIS — R809 Proteinuria, unspecified: Secondary | ICD-10-CM | POA: Diagnosis not present

## 2015-02-27 DIAGNOSIS — R809 Proteinuria, unspecified: Secondary | ICD-10-CM | POA: Diagnosis not present

## 2015-07-17 ENCOUNTER — Encounter (HOSPITAL_COMMUNITY): Payer: Self-pay | Admitting: Dentistry

## 2015-07-17 ENCOUNTER — Ambulatory Visit (HOSPITAL_COMMUNITY): Payer: Self-pay | Admitting: Dentistry

## 2015-07-17 VITALS — BP 126/61 | HR 98 | Temp 98.1°F

## 2015-07-17 DIAGNOSIS — K08109 Complete loss of teeth, unspecified cause, unspecified class: Secondary | ICD-10-CM | POA: Diagnosis not present

## 2015-07-17 DIAGNOSIS — K0820 Unspecified atrophy of edentulous alveolar ridge: Secondary | ICD-10-CM

## 2015-07-17 DIAGNOSIS — Z463 Encounter for fitting and adjustment of dental prosthetic device: Secondary | ICD-10-CM | POA: Diagnosis not present

## 2015-07-17 NOTE — Patient Instructions (Addendum)
Plan:  1. Patient is to keep dentures out if sore spots arise. Use salt water rinses as needed.   2. Return to clinic in one year for denture recall.  3. Discussed fabrication of new upper and lower complete dentures. Patient refused at this time. Patient will call if she changes her mind. 4. Call if other problems arise before then.    Lenn Cal, DDS      Instructions for Denture Use and Care  [place] Soon after you begin wearing your dentures, you may feel that your dentures are too large or even loose.  As our mouth and facial muscles become accustomed to the dentures, these feelings will go away.  You also may feel that you are salivating more than you normally do.  This feeling should go away as you get used to having the dentures in your mouth.  You may bite your cheek or your tongue; this will eventually resolve itself as you wear your dentures.  Some soreness is to be expected, but you should not hurt.  If your mouth hurts, call your dentist.  A denture adhesive may occasionally be necessary to hold your dentures in place more securely.  The dentist will let you know when one is recommended for you.  SPEAKING Wearing dentures will change the sound of your voice initially.  This will be noticed by you more than anyone else.  Bite and swallow before you speak, in order to place your dentures in position so that you may speak more clearly.  Practice speaking by reading aloud or counting from 1 to 100 very slowly and distinctly.  After some practice your mouth will become accustomed to your dentures and you will speak more clearly.  EATING Chewing will definitely be different after you receive your dentures.  With a little practice and patience you should be able to eat just about any kind of food.  Begin by eating small quantities of food that are cut into small pieces.  Star with soft foods such as eggs, cooked vegetables, or puddings.  As you gain  confidence advance  Your diet to whatever texture foods you can tolerate.  DENTURE CARE Dentures can collect plaque and calculus much the same as natural teeth can.  If not removed on a regular basis, your dentures will not look or feel clean, and you will experience denture odor.  It is very important that you remove your dentures at bedtime and clean them thoroughly.  You should: 1. Clean your dentures over a sink full of water so if dropped, breakage will be prevented. 2. Rinse your dentures with cool water to remove any large food particles. 3. Use soap and water or a denture cleanser or paste to clean the dentures.  Do not use regular toothpaste as it may abrade the denture base or teeth. 4. Use a moistened denture brush to clean all surfaces (inside and outside). 5. Rinse thoroughly to remove any remaining soap or denture cleanser. 6. Use a soft bristle toothbrush to gently brush any natural teeth, gums, tongue, and palate at bedtime and before reinserting your dentures. 7. Do not sleep with your dentures in your mouth at night.  Remove your dentures and soak them overnight in a denture cup filled with water or denture solution as recommended by your dentist.  This routine will become second nature and will increase the life and comfort of your dentures.  Please do not try to adjust these  dentures yourself; you could damage them.  FOLLOW-UP You should call or make an appointment with your dentist.  Your dentist would like to see you at least once a year for a check-up and examination.

## 2015-07-17 NOTE — Progress Notes (Signed)
07/17/2015  Patient:            MALONIE TATUM Date of Birth:  [birth year] MRN:                [id number]  BP 126/61 mmHg  Pulse 98  Temp(Src) 98.1 F (36.7 C) (Oral)   Sophronia L Viscomi is a 55 year old female that presents for periodic oral exam and evaluation of dentures. Patient with a history of lung cancer and is status post chemotherapy with Dr. Marko Plume. Patient had full mouth extractions with alveoloplasty on 01/27/2008 and upper and lower complete dentures were inserted on 04/06/2008. Patient now presents for periodic oral examination and evaluation of dentures.   Patient Active Problem List   Diagnosis Date Noted  . Aortic insufficiency 07/28/2014  . Pulmonic stenosis 07/28/2014  . Benign essential HTN 07/28/2014    Past Medical History  Diagnosis Date  . History of lung cancer   . Rheumatoid arthritis(714.0)   . S/P chemotherapy, time since greater than 12 weeks   . S/P radiation therapy   . Aortic insufficiency 07/28/2014    Mild to moderate  . Pulmonic stenosis 07/28/2014  . Benign essential HTN 07/28/2014   Past Surgical History  Procedure Laterality Date  . S/p multiple extractions  01/27/2008    Status post multiple extractions with alveoloplasty and pre-prosthetic surgery in the operating room-Dr. Enrique Sack    ALLERGIES/ADVERSE DRUG REACTIONS: No Known Allergies  MEDICATIONS: Current Outpatient Prescriptions  Medication Sig Dispense Refill  . DULoxetine (CYMBALTA) 60 MG capsule Take 60 mg by mouth daily.    Marland Kitchen gabapentin (NEURONTIN) 300 MG capsule Take 300 mg by mouth 2 (two) times daily.  5  . HYDROcodone-acetaminophen (VICODIN) 5-500 MG per tablet Take 1 tablet by mouth every 6 (six) hours as needed. For pain    . hydroxychloroquine (PLAQUENIL) 200 MG tablet Take 200 mg by mouth 2 (two) times daily.    Marland Kitchen leflunomide (ARAVA) 20 MG tablet Take 20 mg by mouth daily.    Marland Kitchen losartan (COZAAR) 100 MG tablet Take 100 mg by mouth daily.    . methotrexate (RHEUMATREX) 2.5  MG tablet Take 10 mg by mouth once a week. Caution:Chemotherapy. Protect from light.    . Multiple Vitamins-Minerals (MULTIVITAMINS THER. W/MINERALS) TABS Take 1 tablet by mouth daily.    . nabumetone (RELAFEN) 750 MG tablet Take 750 mg by mouth 2 (two) times daily.    Marland Kitchen tiZANidine (ZANAFLEX) 4 MG tablet Take 4 mg by mouth as needed. For muscle spasms  2  . traZODone (DESYREL) 100 MG tablet Take 100 mg by mouth at bedtime.     No current facility-administered medications for this visit.    CC: " No problems with my dentures".  DENTAL EXAM: General:  Patient is a well-developed, well-nourished female in no acute distress . Vitals: BP 126/61 mmHg  Pulse 98  Temp(Src) 98.1 F (36.7 C) (Oral) Extraoral Exam:  There is no  lymphadenopathy.  The patient denies acute TMJ Symptoms.  The patient NO longer has the angular cheilitis. Intraoral  Exam:  Normal Saliva.  No evidence of denture irritation or erythema.  Dentition:  Edentulous. Atrophy of edentulous ridges noted. Prosthodontic: Dentures are stable and retentive. Pressure indicating paste was applied to dentures and adjustments made as needed. [place] Occlusion:  Occlusion is acceptable in centric relation and protrusive strokes. Patient has worn denture teeth with loss of some vertical dimension.   Plan:  1. Patient is to keep dentures  out if sore spots arise. Use salt water rinses as needed.   2. Return to clinic in one year for denture recall.  3. Discussed fabrication of new upper and lower complete dentures. Patient refused at this time. Patient will call if she changes her mind. 4. Call if other problems arise before then.    Lenn Cal, DDS

## 2015-08-08 ENCOUNTER — Ambulatory Visit (INDEPENDENT_AMBULATORY_CARE_PROVIDER_SITE_OTHER): Payer: Medicare Other | Admitting: Cardiology

## 2015-08-08 ENCOUNTER — Encounter: Payer: Self-pay | Admitting: Cardiology

## 2015-08-08 VITALS — BP 122/82 | HR 88 | Ht 65.0 in | Wt 212.0 lb

## 2015-08-08 DIAGNOSIS — G4700 Insomnia, unspecified: Secondary | ICD-10-CM | POA: Insufficient documentation

## 2015-08-08 DIAGNOSIS — R0602 Shortness of breath: Secondary | ICD-10-CM

## 2015-08-08 DIAGNOSIS — B0229 Other postherpetic nervous system involvement: Secondary | ICD-10-CM | POA: Insufficient documentation

## 2015-08-08 DIAGNOSIS — I10 Essential (primary) hypertension: Secondary | ICD-10-CM

## 2015-08-08 DIAGNOSIS — M069 Rheumatoid arthritis, unspecified: Secondary | ICD-10-CM | POA: Insufficient documentation

## 2015-08-08 DIAGNOSIS — Z85118 Personal history of other malignant neoplasm of bronchus and lung: Secondary | ICD-10-CM | POA: Insufficient documentation

## 2015-08-08 DIAGNOSIS — I370 Nonrheumatic pulmonary valve stenosis: Secondary | ICD-10-CM | POA: Diagnosis not present

## 2015-08-08 DIAGNOSIS — F329 Major depressive disorder, single episode, unspecified: Secondary | ICD-10-CM | POA: Insufficient documentation

## 2015-08-08 DIAGNOSIS — I351 Nonrheumatic aortic (valve) insufficiency: Secondary | ICD-10-CM

## 2015-08-08 DIAGNOSIS — M797 Fibromyalgia: Secondary | ICD-10-CM | POA: Insufficient documentation

## 2015-08-08 DIAGNOSIS — M542 Cervicalgia: Secondary | ICD-10-CM | POA: Insufficient documentation

## 2015-08-08 DIAGNOSIS — I359 Nonrheumatic aortic valve disorder, unspecified: Secondary | ICD-10-CM | POA: Insufficient documentation

## 2015-08-08 DIAGNOSIS — K219 Gastro-esophageal reflux disease without esophagitis: Secondary | ICD-10-CM | POA: Insufficient documentation

## 2015-08-08 LAB — BRAIN NATRIURETIC PEPTIDE: Brain Natriuretic Peptide: 105.6 pg/mL — ABNORMAL HIGH (ref <100)

## 2015-08-08 NOTE — Progress Notes (Signed)
Cardiology Office Note    Date:  08/08/2015   ID:  JAHLISA ROSSITTO, DOB 30-Jun-1960, MRN [id number]  PCP:  Chelsea Kroner, MD  Cardiologist:  Chelsea Him, MD   Chief Complaint  Patient presents with  . Aortic Insuffiency  . Hypertension    History of Present Illness:  [name] is a 55 y.o. female who presents for followup of mild to moderate AI and very mild pulmonic stenosis. She has a history of 2D echo in 2016 showing mild AVSC with mild to moderate AI and minimal PS. She has a history of lung CA s/p chemo and radiation, HTN, RA, CVA and fibromyalgia.She was having some chest pressure a while ago in the setting of elevated BP and is now on BP meds with resolution of the pressure.  She had a normal nuclear stress test a year ago.   She denies any chest pain, LE edema,palpitations or syncope. She has chronic SOB with DOE that she attributes to being overweight.      Past Medical History  Diagnosis Date  . History of lung cancer   . Rheumatoid arthritis(714.0)   . S/P chemotherapy, time since greater than 12 weeks   . S/P radiation therapy   . Aortic insufficiency 07/28/2014    Mild to moderate  . Pulmonic stenosis 07/28/2014  . Benign essential HTN 07/28/2014    Past Surgical History  Procedure Laterality Date  . S/p multiple extractions  01/27/2008    Status post multiple extractions with alveoloplasty and pre-prosthetic surgery in the operating room-Dr. Enrique Sack    Current Medications: Outpatient Prescriptions Prior to Visit  Medication Sig Dispense Refill  . DULoxetine (CYMBALTA) 60 MG capsule Take 60 mg by mouth daily.    Marland Kitchen gabapentin (NEURONTIN) 300 MG capsule Take 300 mg by mouth 2 (two) times daily.  5  . HYDROcodone-acetaminophen (VICODIN) 5-500 MG per tablet Take 1 tablet by mouth every 6 (six) hours as needed. For pain    . hydroxychloroquine (PLAQUENIL) 200 MG tablet Take 200 mg by mouth 2 (two) times daily.    Marland Kitchen leflunomide (ARAVA) 20 MG tablet Take 20 mg  by mouth daily.    Marland Kitchen losartan (COZAAR) 100 MG tablet Take 100 mg by mouth daily.    . methotrexate (RHEUMATREX) 2.5 MG tablet Take 10 mg by mouth once a week. Caution:Chemotherapy. Protect from light.    . Multiple Vitamins-Minerals (MULTIVITAMINS THER. W/MINERALS) TABS Take 1 tablet by mouth daily.    . nabumetone (RELAFEN) 750 MG tablet Take 750 mg by mouth 2 (two) times daily.    Marland Kitchen tiZANidine (ZANAFLEX) 4 MG tablet Take 4 mg by mouth as needed. For muscle spasms  2  . traZODone (DESYREL) 100 MG tablet Take 100 mg by mouth at bedtime.     No facility-administered medications prior to visit.     Allergies:   Review of patient's allergies indicates no known allergies.   Social History   Social History  . Marital Status: Divorced    Spouse Name: N/A  . Number of Children: N/A  . Years of Education: N/A   Social History Main Topics  . Smoking status: Former Smoker -- 1.50 packs/day for 30 years    Types: Cigarettes    Quit date: 02/25/2004  . Smokeless tobacco: Never Used  . Alcohol Use: No     Comment: quit 2006  . Drug Use: None  . Sexual Activity: Not Asked   Other Topics Concern  . None  Social History Narrative     Family History:  The patient's family history includes Heart attack in her father; Liver disease in her father; Stroke in her mother.   ROS:   Please see the history of present illness.    ROS All other systems reviewed and are negative.   PHYSICAL EXAM:   VS:  BP 122/82 mmHg  Pulse 88  Ht '5\' 5"'$  (1.651 m)  Wt 212 lb (96.163 kg)  BMI 35.28 kg/m2   GEN: Well nourished, well developed, in no acute distress HEENT: normal Neck: no JVD, carotid bruits, or masses Cardiac: RRR; no rubs, or gallops,no edema.  Intact distal pulses bilaterally. 2/6 SM at RUSB to LLSB Respiratory:  clear to auscultation bilaterally, normal work of breathing GI: soft, nontender, nondistended, + BS MS: no deformity or atrophy Skin: warm and dry, no rash Neuro:  Alert and  Oriented x 3, Strength and sensation are intact Psych: euthymic mood, full affect  Wt Readings from Last 3 Encounters:  08/08/15 212 lb (96.163 kg)  08/16/14 227 lb (102.967 kg)  07/28/14 227 lb 9.6 oz (103.239 kg)      Studies/Labs Reviewed:   EKG:  EKG ist ordered today and showed NSR with inferior and anterior infarct no change from 1 year ago  Recent Labs: No results found for requested labs within last 365 days.   Lipid Panel No results found for: CHOL, TRIG, HDL, CHOLHDL, VLDL, LDLCALC, LDLDIRECT  Additional studies/ records that were reviewed today include:  none    ASSESSMENT:    1. Aortic insufficiency   2. Pulmonic stenosis   3. Benign essential HTN      PLAN:  In order of problems listed above:  1. Mild to moderate AI by echo a year ago.  Since she is still having DOE I will repeat the echo to rule out progression. 2. Pulmonic stenosis - very minimal by echo a year ago but will reevaluate with echo 3. HTN - BP controlled on current medical regimen.  Continue Losartan. 4. 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  I will check a BNP and reassess LVF with echo.     Medication Adjustments/Labs and Tests Ordered: Current medicines are reviewed at length with the patient today.  Concerns regarding medicines are outlined above.  Medication changes, Labs and Tests ordered today are listed in the Patient Instructions below.  There are no Patient Instructions on file for this visit.   Signed, Chelsea Him, MD  08/08/2015 9:57 AM    [address], [place], [place]  [address] Phone: [phone]; Fax: [phone]

## 2015-08-08 NOTE — Patient Instructions (Addendum)
Medication Instructions:  Your physician recommends that you continue on your current medications as directed. Please refer to the Current Medication list given to you today.   Labwork: TODAY: BNP  Testing/Procedures: Your physician has requested that you have an echocardiogram. Echocardiography is a painless test that uses sound waves to create images of your heart. It provides your doctor with information about the size and shape of your heart and how well your heart's chambers and valves are working. This procedure takes approximately one hour. There are no restrictions for this procedure.  Follow-Up: Your physician wants you to follow-up in: 1 year with Dr. Radford Pax. You will receive a reminder letter in the mail two months in advance. If you don't receive a letter, please call our office to schedule the follow-up appointment.   Any Other Special Instructions Will Be Listed Below (If Applicable).     If you need a refill on your cardiac medications before your next appointment, please call your pharmacy.

## 2015-08-09 ENCOUNTER — Telehealth: Payer: Self-pay | Admitting: Cardiology

## 2015-08-09 NOTE — Telephone Encounter (Signed)
Per DPR form, left detailed message that lab work is normal and no medications are being changed. Instructed her to call back with any questions or concerns.

## 2015-08-09 NOTE — Telephone Encounter (Signed)
Follow-up ° ° ° ° °The pt is returning the nurses call °

## 2015-08-09 NOTE — Telephone Encounter (Signed)
-----   Message from Sueanne Margarita, MD sent at 08/09/2015  9:46 AM EDT ----- Essentially normal BNP

## 2015-08-14 ENCOUNTER — Encounter: Payer: Self-pay | Admitting: Cardiology

## 2015-08-27 ENCOUNTER — Other Ambulatory Visit: Payer: Self-pay

## 2015-08-27 ENCOUNTER — Ambulatory Visit (HOSPITAL_COMMUNITY): Payer: Medicare Other | Attending: Cardiovascular Disease

## 2015-08-27 DIAGNOSIS — Z87891 Personal history of nicotine dependence: Secondary | ICD-10-CM | POA: Diagnosis not present

## 2015-08-27 DIAGNOSIS — I10 Essential (primary) hypertension: Secondary | ICD-10-CM | POA: Diagnosis not present

## 2015-08-27 DIAGNOSIS — Z923 Personal history of irradiation: Secondary | ICD-10-CM | POA: Diagnosis not present

## 2015-08-27 DIAGNOSIS — I2510 Atherosclerotic heart disease of native coronary artery without angina pectoris: Secondary | ICD-10-CM | POA: Insufficient documentation

## 2015-08-27 DIAGNOSIS — C3490 Malignant neoplasm of unspecified part of unspecified bronchus or lung: Secondary | ICD-10-CM | POA: Diagnosis not present

## 2015-08-27 DIAGNOSIS — Z8249 Family history of ischemic heart disease and other diseases of the circulatory system: Secondary | ICD-10-CM | POA: Insufficient documentation

## 2015-08-27 DIAGNOSIS — Z9221 Personal history of antineoplastic chemotherapy: Secondary | ICD-10-CM | POA: Diagnosis not present

## 2015-08-27 DIAGNOSIS — I351 Nonrheumatic aortic (valve) insufficiency: Secondary | ICD-10-CM | POA: Insufficient documentation

## 2015-08-27 LAB — ECHOCARDIOGRAM COMPLETE
AOASC: 32 cm
E decel time: 236 msec
EERAT: 14.29
FS: 38 % (ref 28–44)
IVS/LV PW RATIO, ED: 0.93
LA diam end sys: 34 mm
LA vol A4C: 38.6 ml
LA vol index: 16.6 mL/m2
LADIAMINDEX: 1.67 cm/m2
LASIZE: 34 mm
LAVOL: 33.7 mL
LV TDI E'LATERAL: 5.87
LVEEAVG: 14.29
LVEEMED: 14.29
LVELAT: 5.87 cm/s
LVOT VTI: 20.7 cm
LVOT area: 3.46 cm2
LVOT diameter: 21 mm
LVOT peak vel: 109 cm/s
LVOTSV: 72 mL
MV Dec: 236
MV Peak grad: 3 mmHg
MV pk A vel: 77.1 m/s
MVPKEVEL: 83.9 m/s
P 1/2 time: 487 ms
PW: 9.97 mm — AB (ref 0.6–1.1)
Reg peak vel: 383 cm/s
TAPSE: 23.4 mm
TDI e' medial: 6.2
TR max vel: 383 cm/s

## 2015-08-31 ENCOUNTER — Telehealth: Payer: Self-pay

## 2015-08-31 DIAGNOSIS — I370 Nonrheumatic pulmonary valve stenosis: Secondary | ICD-10-CM

## 2015-08-31 DIAGNOSIS — I351 Nonrheumatic aortic (valve) insufficiency: Secondary | ICD-10-CM

## 2015-08-31 NOTE — Telephone Encounter (Signed)
Informed patient of results and verbal understanding expressed.  Repeat ECHO ordered to be scheduled in 1 year. Patient agrees with treatment plan. 

## 2015-08-31 NOTE — Telephone Encounter (Signed)
-----   Message from Sueanne Margarita, MD sent at 08/30/2015 12:14 PM EDT ----- Echo showed normal LVF with increased stiffness of heart muscle and mild AR and mild PS - repeat echo in 1 year to followup on PS

## 2016-02-13 ENCOUNTER — Other Ambulatory Visit (HOSPITAL_COMMUNITY): Payer: Self-pay | Admitting: Obstetrics and Gynecology

## 2016-02-13 ENCOUNTER — Ambulatory Visit (HOSPITAL_COMMUNITY)
Admission: RE | Admit: 2016-02-13 | Discharge: 2016-02-13 | Disposition: A | Discharge status: Home / Self Care | Payer: Medicare Other | Source: Ambulatory Visit | Attending: Obstetrics and Gynecology | Admitting: Obstetrics and Gynecology

## 2016-02-13 DIAGNOSIS — R1032 Left lower quadrant pain: Secondary | ICD-10-CM

## 2016-02-13 DIAGNOSIS — R102 Pelvic and perineal pain: Secondary | ICD-10-CM | POA: Insufficient documentation

## 2016-02-13 DIAGNOSIS — N854 Malposition of uterus: Secondary | ICD-10-CM | POA: Insufficient documentation

## 2016-06-11 ENCOUNTER — Ambulatory Visit (HOSPITAL_COMMUNITY): Payer: Self-pay | Admitting: Dentistry

## 2016-06-11 ENCOUNTER — Encounter (HOSPITAL_COMMUNITY): Payer: Self-pay | Admitting: Dentistry

## 2016-06-11 VITALS — BP 142/77 | HR 83 | Temp 98.2°F

## 2016-06-11 DIAGNOSIS — Z85118 Personal history of other malignant neoplasm of bronchus and lung: Secondary | ICD-10-CM | POA: Diagnosis not present

## 2016-06-11 DIAGNOSIS — K0820 Unspecified atrophy of edentulous alveolar ridge: Secondary | ICD-10-CM

## 2016-06-11 DIAGNOSIS — K08109 Complete loss of teeth, unspecified cause, unspecified class: Secondary | ICD-10-CM

## 2016-06-11 DIAGNOSIS — Z923 Personal history of irradiation: Secondary | ICD-10-CM | POA: Diagnosis not present

## 2016-06-11 DIAGNOSIS — Z972 Presence of dental prosthetic device (complete) (partial): Secondary | ICD-10-CM

## 2016-06-11 DIAGNOSIS — Z463 Encounter for fitting and adjustment of dental prosthetic device: Secondary | ICD-10-CM

## 2016-06-11 NOTE — Progress Notes (Signed)
06/11/2016  Patient:            [name] Date of Birth:  [birth year] MRN:                [id number]  BP (!) 142/77 (BP Location: Left Arm)   Pulse 83   Temp 98.2 F (36.8 C) (Oral)    Miriam L Hattabaugh is a 56 year old female that presents for periodic oral exam and evaluation of dentures. Patient with a history of lung cancer and is status post chemotherapy with Dr. Marko Plume. Patient had full mouth extractions with alveoloplasty on 01/27/2008 and upper and lower complete dentures were inserted on 04/06/2008.  Patient hasn't been seen for multiple denture adjustment appointments and denture recall examinations. Patient now presents for periodic oral examination and evaluation of dentures. Patient has moved to [place] this will be her last dental examination with dental medicine by patient report.   Patient Active Problem List   Diagnosis Date Noted  . Nonrheumatic aortic valve disorder 08/08/2015  . Fibromyalgia 08/08/2015  . Gastro-esophageal reflux disease without esophagitis 08/08/2015  . Personal history of malignant neoplasm of bronchus and lung 08/08/2015  . Insomnia 08/08/2015  . Major depressive disorder, single episode 08/08/2015  . Rheumatoid arthritis ([place]) 08/08/2015  . Cervicalgia 08/08/2015  . Post herpetic neuralgia 08/08/2015  . Aortic insufficiency 07/28/2014  . Pulmonic stenosis 07/28/2014  . Benign essential HTN 07/28/2014    Past Medical History:  Diagnosis Date  . Aortic insufficiency 07/28/2014   Mild to moderate  . Benign essential HTN 07/28/2014  . History of lung cancer   . Pulmonic stenosis 07/28/2014  . Rheumatoid arthritis(714.0)   . S/P chemotherapy, time since greater than 12 weeks   . S/P radiation therapy    Past Surgical History:  Procedure Laterality Date  . S/P Multiple extractions  01/27/2008   Status post multiple extractions with alveoloplasty and pre-prosthetic surgery in the operating room-Dr. Enrique Sack    ALLERGIES/ADVERSE DRUG  REACTIONS: No Known Allergies  MEDICATIONS: Current Outpatient Prescriptions  Medication Sig Dispense Refill  . DULoxetine (CYMBALTA) 60 MG capsule Take 60 mg by mouth daily.    Marland Kitchen gabapentin (NEURONTIN) 300 MG capsule Take 300 mg by mouth 2 (two) times daily.  5  . HYDROcodone-acetaminophen (VICODIN) 5-500 MG per tablet Take 1 tablet by mouth every 6 (six) hours as needed. For pain    . hydroxychloroquine (PLAQUENIL) 200 MG tablet Take 200 mg by mouth 2 (two) times daily.    Marland Kitchen leflunomide (ARAVA) 20 MG tablet Take 20 mg by mouth daily.    Marland Kitchen losartan (COZAAR) 100 MG tablet Take 100 mg by mouth daily.    . methotrexate (RHEUMATREX) 2.5 MG tablet Take 10 mg by mouth once a week. Caution:Chemotherapy. Protect from light.    . Multiple Vitamins-Minerals (MULTIVITAMINS THER. W/MINERALS) TABS Take 1 tablet by mouth daily.    . nabumetone (RELAFEN) 750 MG tablet Take 750 mg by mouth 2 (two) times daily.    Marland Kitchen tiZANidine (ZANAFLEX) 4 MG tablet Take 4 mg by mouth as needed. For muscle spasms  2  . traZODone (DESYREL) 100 MG tablet Take 100 mg by mouth at bedtime.     No current facility-administered medications for this visit.     CC: " No problems with my dentures".  DENTAL EXAM: General:  Patient is a well-developed, well-nourished female in no acute distress . Vitals: BP (!) 142/77 (BP Location: Left Arm)   Pulse 83   Temp  98.2 F (36.8 C) (Oral)  Extraoral Exam:  There is no  lymphadenopathy.  The patient denies acute TMJ Symptoms.  The patient NO longer has the angular cheilitis. Intraoral  Exam:  Normal Saliva.  No evidence of denture irritation or erythema.  Dentition:  Edentulous. Atrophy of edentulous ridges noted. Prosthodontic: Dentures are stable and retentive. Pressure indicating paste was applied to dentures and adjustments made as needed. [place] Occlusion:  Occlusion is acceptable in centric relation and protrusive strokes. Patient has worn denture teeth with loss of some  vertical dimension.   Plan:  1. Patient is to keep dentures out if sore spots arise. Use salt water rinses as needed.   2. Follow-up with a dentist of her choice in [place] for evaluation of existing dentures or fabrication of new upper and lower complete dentures.    Lenn Cal, DDS4/18/2018  Patient:            [name] Date of Birth:  [birth year] MRN:                [id number]  BP (!) 142/77 (BP Location: Left Arm)   Pulse 83   Temp 98.2 F (36.8 C) (Oral)    [name] is a 56 year old female that presents for periodic oral exam and evaluation of dentures. Patient with a history of lung cancer and is status post chemotherapy with Dr. Marko Plume. Patient had full mouth extractions with alveoloplasty on 01/27/2008 and upper and lower complete dentures were inserted on 04/06/2008.  Patient hasn't been seen for multiple denture adjustment appointments and denture recall examinations. Patient now presents for periodic oral examination and evaluation of dentures. Patient has moved to [place] this will be her last dental examination with dental medicine by patient report.   Patient Active Problem List   Diagnosis Date Noted  . Nonrheumatic aortic valve disorder 08/08/2015  . Fibromyalgia 08/08/2015  . Gastro-esophageal reflux disease without esophagitis 08/08/2015  . Personal history of malignant neoplasm of bronchus and lung 08/08/2015  . Insomnia 08/08/2015  . Major depressive disorder, single episode 08/08/2015  . Rheumatoid arthritis ([place]) 08/08/2015  . Cervicalgia 08/08/2015  . Post herpetic neuralgia 08/08/2015  . Aortic insufficiency 07/28/2014  . Pulmonic stenosis 07/28/2014  . Benign essential HTN 07/28/2014    Past Medical History:  Diagnosis Date  . Aortic insufficiency 07/28/2014   Mild to moderate  . Benign essential HTN 07/28/2014  . History of lung cancer   . Pulmonic stenosis 07/28/2014  . Rheumatoid arthritis(714.0)   . S/P chemotherapy, time since  greater than 12 weeks   . S/P radiation therapy    Past Surgical History:  Procedure Laterality Date  . S/P Multiple extractions  01/27/2008   Status post multiple extractions with alveoloplasty and pre-prosthetic surgery in the operating room-Dr. Enrique Sack    ALLERGIES/ADVERSE DRUG REACTIONS: No Known Allergies  MEDICATIONS: Current Outpatient Prescriptions  Medication Sig Dispense Refill  . DULoxetine (CYMBALTA) 60 MG capsule Take 60 mg by mouth daily.    Marland Kitchen gabapentin (NEURONTIN) 300 MG capsule Take 300 mg by mouth 2 (two) times daily.  5  . HYDROcodone-acetaminophen (VICODIN) 5-500 MG per tablet Take 1 tablet by mouth every 6 (six) hours as needed. For pain    . hydroxychloroquine (PLAQUENIL) 200 MG tablet Take 200 mg by mouth 2 (two) times daily.    Marland Kitchen leflunomide (ARAVA) 20 MG tablet Take 20 mg by mouth daily.    Marland Kitchen losartan (COZAAR) 100 MG tablet Take  100 mg by mouth daily.    . methotrexate (RHEUMATREX) 2.5 MG tablet Take 10 mg by mouth once a week. Caution:Chemotherapy. Protect from light.    . Multiple Vitamins-Minerals (MULTIVITAMINS THER. W/MINERALS) TABS Take 1 tablet by mouth daily.    . nabumetone (RELAFEN) 750 MG tablet Take 750 mg by mouth 2 (two) times daily.    Marland Kitchen tiZANidine (ZANAFLEX) 4 MG tablet Take 4 mg by mouth as needed. For muscle spasms  2  . traZODone (DESYREL) 100 MG tablet Take 100 mg by mouth at bedtime.     No current facility-administered medications for this visit.     CC: " No problems with my dentures".  DENTAL EXAM: General:  Patient is a well-developed, well-nourished female in no acute distress . Vitals: BP (!) 142/77 (BP Location: Left Arm)   Pulse 83   Temp 98.2 F (36.8 C) (Oral)  Extraoral Exam:  There is no  lymphadenopathy.  The patient denies acute TMJ Symptoms.  The patient NO longer has the angular cheilitis. Intraoral  Exam:  Normal Saliva.  No evidence of denture irritation or erythema.  Dentition:  Edentulous. Atrophy of edentulous  ridges noted. Prosthodontic: Dentures are stable and retentive. Pressure indicating paste was applied to dentures and adjustments made as needed. [place] Occlusion:  Occlusion is acceptable in centric relation and protrusive strokes. Patient has worn denture teeth with loss of some vertical dimension.   Plan:  1. Patient is to keep dentures out if sore spots arise. Use salt water rinses as needed.   2. Follow-up with a dentist of her choice in [place] for evaluation of existing dentures or fabrication of new upper and lower complete dentures.    Lenn Cal, DDS

## 2016-06-11 NOTE — Patient Instructions (Signed)
Plan:  1. Patient is to keep dentures out if sore spots arise. Use salt water rinses as needed.   2. Follow-up with a dentist of her choice in [place] for evaluation of existing dentures or fabrication of new upper and lower complete dentures.    Lenn Cal, DDS

## 2016-07-22 ENCOUNTER — Encounter (HOSPITAL_COMMUNITY): Payer: Self-pay | Admitting: Dentistry

## 2016-08-26 ENCOUNTER — Telehealth (HOSPITAL_COMMUNITY): Payer: Self-pay | Admitting: Cardiology

## 2016-08-26 NOTE — Telephone Encounter (Signed)
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  She will be removed from the workqueue.

## 2017-07-28 ENCOUNTER — Telehealth: Payer: Self-pay | Admitting: *Deleted

## 2017-07-28 NOTE — Telephone Encounter (Signed)
Faxed [facility] to [facility]; release [phone]
